# Patient Record
Sex: Female | Born: 1965 | Race: White | Hispanic: No | Smoking: Current every day smoker
Health system: Southern US, Community
[De-identification: ages and names within clinical notes are randomized; demographics above are authoritative.]

## PROBLEM LIST (undated history)

## (undated) DIAGNOSIS — F32A Depression, unspecified: Secondary | ICD-10-CM

## (undated) DIAGNOSIS — F4310 Post-traumatic stress disorder, unspecified: Secondary | ICD-10-CM

## (undated) DIAGNOSIS — F329 Major depressive disorder, single episode, unspecified: Secondary | ICD-10-CM

## (undated) DIAGNOSIS — G8929 Other chronic pain: Secondary | ICD-10-CM

## (undated) DIAGNOSIS — M545 Low back pain: Secondary | ICD-10-CM

## (undated) DIAGNOSIS — R55 Syncope and collapse: Secondary | ICD-10-CM

## (undated) DIAGNOSIS — M1990 Unspecified osteoarthritis, unspecified site: Secondary | ICD-10-CM

## (undated) DIAGNOSIS — F419 Anxiety disorder, unspecified: Secondary | ICD-10-CM

## (undated) HISTORY — DX: Low back pain: M54.5

## (undated) HISTORY — PX: NECK SURGERY: SHX720

## (undated) HISTORY — PX: BACK SURGERY: SHX140

## (undated) HISTORY — DX: Post-traumatic stress disorder, unspecified: F43.10

## (undated) HISTORY — DX: Other chronic pain: G89.29

---

## 2015-11-01 ENCOUNTER — Emergency Department (HOSPITAL_COMMUNITY): Payer: Self-pay

## 2015-11-01 ENCOUNTER — Encounter (HOSPITAL_COMMUNITY): Payer: Self-pay | Admitting: Emergency Medicine

## 2015-11-01 ENCOUNTER — Emergency Department (HOSPITAL_COMMUNITY)
Admission: EM | Admit: 2015-11-01 | Discharge: 2015-11-01 | Disposition: A | Discharge status: Home / Self Care | Payer: Self-pay | Attending: Emergency Medicine | Admitting: Emergency Medicine

## 2015-11-01 DIAGNOSIS — F17200 Nicotine dependence, unspecified, uncomplicated: Secondary | ICD-10-CM | POA: Insufficient documentation

## 2015-11-01 DIAGNOSIS — R0602 Shortness of breath: Secondary | ICD-10-CM | POA: Insufficient documentation

## 2015-11-01 DIAGNOSIS — R0789 Other chest pain: Secondary | ICD-10-CM | POA: Insufficient documentation

## 2015-11-01 DIAGNOSIS — Z3202 Encounter for pregnancy test, result negative: Secondary | ICD-10-CM | POA: Insufficient documentation

## 2015-11-01 LAB — BASIC METABOLIC PANEL
Anion gap: 9 (ref 5–15)
BUN: 10 mg/dL (ref 6–20)
CALCIUM: 9.7 mg/dL (ref 8.9–10.3)
CHLORIDE: 105 mmol/L (ref 101–111)
CO2: 27 mmol/L (ref 22–32)
CREATININE: 0.81 mg/dL (ref 0.44–1.00)
Glucose, Bld: 101 mg/dL — ABNORMAL HIGH (ref 65–99)
Potassium: 4.2 mmol/L (ref 3.5–5.1)
SODIUM: 141 mmol/L (ref 135–145)

## 2015-11-01 LAB — CBC
HCT: 43.3 % (ref 36.0–46.0)
Hemoglobin: 14.8 g/dL (ref 12.0–15.0)
MCH: 32.3 pg (ref 26.0–34.0)
MCHC: 34.2 g/dL (ref 30.0–36.0)
MCV: 94.5 fL (ref 78.0–100.0)
PLATELETS: 243 10*3/uL (ref 150–400)
RBC: 4.58 MIL/uL (ref 3.87–5.11)
RDW: 12.9 % (ref 11.5–15.5)
WBC: 10.6 10*3/uL — AB (ref 4.0–10.5)

## 2015-11-01 LAB — HEPATIC FUNCTION PANEL
ALK PHOS: 66 U/L (ref 38–126)
ALT: 18 U/L (ref 14–54)
AST: 22 U/L (ref 15–41)
Albumin: 4.1 g/dL (ref 3.5–5.0)
BILIRUBIN TOTAL: 0.5 mg/dL (ref 0.3–1.2)
Total Protein: 7 g/dL (ref 6.5–8.1)

## 2015-11-01 LAB — I-STAT TROPONIN, ED: TROPONIN I, POC: 0 ng/mL (ref 0.00–0.08)

## 2015-11-01 LAB — I-STAT BETA HCG BLOOD, ED (MC, WL, AP ONLY): I-stat hCG, quantitative: <5 m[IU]/mL (ref <5)

## 2015-11-01 LAB — D-DIMER, QUANTITATIVE (NOT AT ARMC): D DIMER QUANT: 0.45 ug{FEU}/mL (ref 0.00–0.50)

## 2015-11-01 LAB — LIPASE, BLOOD: LIPASE: 30 U/L (ref 11–51)

## 2015-11-01 MED ORDER — KETOROLAC TROMETHAMINE 30 MG/ML IJ SOLN
30.0000 mg | Freq: Once | INTRAMUSCULAR | Status: AC
  Administered 2015-11-01: 30 mg via INTRAVENOUS
  Filled 2015-11-01: qty 1

## 2015-11-01 MED ORDER — GI COCKTAIL ~~LOC~~
30.0000 mL | Freq: Once | ORAL | Status: AC
  Administered 2015-11-01: 30 mL via ORAL
  Filled 2015-11-01: qty 30

## 2015-11-01 MED ORDER — ONDANSETRON HCL 4 MG/2ML IJ SOLN
4.0000 mg | Freq: Once | INTRAMUSCULAR | Status: AC
  Administered 2015-11-01: 4 mg via INTRAVENOUS
  Filled 2015-11-01: qty 2

## 2015-11-01 MED ORDER — HYDROMORPHONE HCL 1 MG/ML IJ SOLN
1.0000 mg | Freq: Once | INTRAMUSCULAR | Status: AC
  Administered 2015-11-01: 1 mg via INTRAVENOUS
  Filled 2015-11-01: qty 1

## 2015-11-01 NOTE — ED Notes (Addendum)
Pt would not allow me to check vitals. Request for IV to be taken out so she could leave since nobody will come in to give her any med. Nurse have been info

## 2015-11-01 NOTE — ED Notes (Signed)
Pt states she has been having some discomfort in her chest esp when she eats or swallows anything  Pt states yesterday she got a pain under her ribs on the right side  Pt states it hurts worse with movement and deep breaths  Pt denies injury

## 2015-11-01 NOTE — ED Provider Notes (Signed)
Patient received in sign out from PA [place] at shift change.  Please see prior note for full H&P.  Briefly, 50 y.o. F with 2 days of right sided chest pain, worse with deep inspiration.  Was having some pain with swallowing, however none currently.  On exam, pain was found to be reproducible with palpation.  No cardiac hx.  No hx of DVT or PE.    Plan:  Lab work thus far has been reassuring including d-dimer and troponin.  Waiting for hepatic panel and lipase.  If negative, patient to be discharged home with treatment for MSK chest wall pain.    Results for orders placed or performed during the hospital encounter of 11/01/15  Basic metabolic panel  Result Value Ref Range   Sodium 141 135 - 145 mmol/L   Potassium 4.2 3.5 - 5.1 mmol/L   Chloride 105 101 - 111 mmol/L   CO2 27 22 - 32 mmol/L   Glucose, Bld 101 (H) 65 - 99 mg/dL   BUN 10 6 - 20 mg/dL   Creatinine, Ser 1.61 0.44 - 1.00 mg/dL   Calcium 9.7 8.9 - 09.6 mg/dL   GFR calc non Af Amer >60 >60 mL/min   GFR calc Af Amer >60 >60 mL/min   Anion gap 9 5 - 15  CBC  Result Value Ref Range   WBC 10.6 (H) 4.0 - 10.5 K/uL   RBC 4.58 3.87 - 5.11 MIL/uL   Hemoglobin 14.8 12.0 - 15.0 g/dL   HCT 04.5 40.9 - 81.1 %   MCV 94.5 78.0 - 100.0 fL   MCH 32.3 26.0 - 34.0 pg   MCHC 34.2 30.0 - 36.0 g/dL   RDW 91.4 78.2 - 95.6 %   Platelets 243 150 - 400 K/uL  D-dimer, quantitative (not at [facility])  Result Value Ref Range   D-Dimer, Quant 0.45 0.00 - 0.50 ug/mL-FEU  Hepatic function panel  Result Value Ref Range   Total Protein 7.0 6.5 - 8.1 g/dL   Albumin 4.1 3.5 - 5.0 g/dL   AST 22 15 - 41 U/L   ALT 18 14 - 54 U/L   Alkaline Phosphatase 66 38 - 126 U/L   Total Bilirubin 0.5 0.3 - 1.2 mg/dL   Bilirubin, Direct <2.1 (L) 0.1 - 0.5 mg/dL   Indirect Bilirubin NOT CALCULATED 0.3 - 0.9 mg/dL  Lipase, blood  Result Value Ref Range   Lipase 30 11 - 51 U/L  I-stat troponin, ED (not at [facility])  Result Value Ref Range   Troponin i, poc 0.00 0.00 -  0.08 ng/mL   Comment 3           Dg Chest 2 View  11/01/2015  CLINICAL DATA:  Acute onset of generalized chest discomfort. Shortness of breath. Initial encounter. EXAM: CHEST  2 VIEW COMPARISON:  None. FINDINGS: The lungs are well-aerated and clear. There is no evidence of focal opacification, pleural effusion or pneumothorax. The heart is normal in size; the mediastinal contour is within normal limits. No acute osseous abnormalities are seen. Cervical spinal fusion hardware is partially imaged. IMPRESSION: No acute cardiopulmonary process seen. Electronically Signed   By: Roanna Raider M.D.   On: 11/01/2015 02:52    7:42 AM Went in to re-evaluate patient and she begins yelling "take this IV out, i am over this and no one is helping me".  I asked patient what i could do for her and she states she just wanted to leave.  She did  not allow me to discuss her remaining lab results, she just began getting dressed and gathering her belongins.  Made nurse aware to take IV out prior to patient leaving.  Encouraged to follow-up with a PCP in the area and was given resource guide.  Discussed plan with patient, he/she acknowledged understanding and agreed with plan of care.  Return precautions given for new or worsening symptoms.  Garlon HatchetLisa M Sheridyn Canino, PA-C 11/01/15 [id number]  Leta BaptistEmily Roe Nguyen, MD 11/02/15 [phone]

## 2015-11-01 NOTE — ED Provider Notes (Signed)
CSN: [id number]     Arrival date & time 11/01/15  0131 History   First MD Initiated Contact with Patient 11/01/15 [phone]     Chief Complaint  Patient presents with  . Chest Pain     (Consider location/radiation/quality/duration/timing/severity/associated sxs/prior Treatment) HPI Comments: Patient is a 50 year old female with a history of neck surgery and back surgery. She presents to the emergency department for further evaluation of right-sided chest pain. She states that pain has been worse over the past 2 days. She describes the pain as throbbing with intermittent sharp pain sensations. It is worse with movement and deep breathing. She took a BC powder without relief. Patient stated that she had previously noted some discomfort in her central chest and throat when she would eat or swallow. She has not had any issues with this today. Patient denies a hx of similar symptoms. No associated fever, syncope, near syncope, leg swelling, vomiting, diarrhea, and abdominal pain. No hematuria or dysuria. No recent surgeries or hospitalizations. No recent travel or hx of blood clots. Also no hx of ACS, DM, HTN, or HLD.  Patient is a 50 y.o. female presenting with chest pain. The history is provided by the patient. No language interpreter was used.  Chest Pain Associated symptoms: shortness of breath   Associated symptoms: no fever, no nausea and not vomiting     History reviewed. No pertinent past medical history. Past Surgical History  Procedure Laterality Date  . Neck surgery    . Back surgery     Family History  Problem Relation Age of Onset  . Cancer Other    Social History  Substance Use Topics  . Smoking status: Current Some Day Smoker  . Smokeless tobacco: None  . Alcohol Use: No   OB History    No data available      Review of Systems  Constitutional: Negative for fever.  Respiratory: Positive for shortness of breath. Negative for apnea.   Cardiovascular: Positive for chest  pain. Negative for leg swelling.  Gastrointestinal: Negative for nausea and vomiting.  Neurological: Negative for syncope.  All other systems reviewed and are negative.   Allergies  Review of patient's allergies indicates no known allergies.  Home Medications   Prior to Admission medications   Not on File   BP 131/87 mmHg  Pulse 74  Temp(Src) 97.7 F (36.5 C) (Oral)  Resp 20  SpO2 98%  LMP    Physical Exam  Constitutional: She is oriented to person, place, and time. She appears well-developed and well-nourished. No distress.  Nontoxic/nonseptic appearing  HENT:  Head: Normocephalic and atraumatic.  Mouth/Throat: Oropharynx is clear and moist. No oropharyngeal exudate.  Eyes: Conjunctivae and EOM are normal. No scleral icterus.  Neck: Normal range of motion.  Cardiovascular: Normal rate, regular rhythm and intact distal pulses.   Pulmonary/Chest: Effort normal and breath sounds normal. No respiratory distress. She has no wheezes. She has no rales. She exhibits tenderness. She exhibits no crepitus and no deformity.    Lungs grossly clear. There is splinting with deep inspiration. No rales or rhonchi. Chest expansion symmetric.  Abdominal: Soft. She exhibits no distension. There is no tenderness. There is no rebound and no guarding.  Soft, nontender and nondistended abdomen. No peritoneal signs or guarding. Negative Murphy sign.  Musculoskeletal: Normal range of motion.  Neurological: She is alert and oriented to person, place, and time. She exhibits normal muscle tone. Coordination normal.  Patient moving all extremities  Skin: Skin is warm  and dry. No rash noted. She is not diaphoretic. No erythema. No pallor.  Psychiatric: She has a normal mood and affect. Her behavior is normal.  Nursing note and vitals reviewed.   ED Course  Procedures (including critical care time) Labs Review Labs Reviewed  BASIC METABOLIC PANEL - Abnormal; Notable for the following:    Glucose,  Bld 101 (*)    All other components within normal limits  CBC - Abnormal; Notable for the following:    WBC 10.6 (*)    All other components within normal limits  D-DIMER, QUANTITATIVE (NOT AT [facility])  HEPATIC FUNCTION PANEL  LIPASE, BLOOD  I-STAT TROPOININ, ED  I-STAT BETA HCG BLOOD, ED ([facility], [facility], AP ONLY)    Imaging Review Dg Chest 2 View  11/01/2015  CLINICAL DATA:  Acute onset of generalized chest discomfort. Shortness of breath. Initial encounter. EXAM: CHEST  2 VIEW COMPARISON:  None. FINDINGS: The lungs are well-aerated and clear. There is no evidence of focal opacification, pleural effusion or pneumothorax. The heart is normal in size; the mediastinal contour is within normal limits. No acute osseous abnormalities are seen. Cervical spinal fusion hardware is partially imaged. IMPRESSION: No acute cardiopulmonary process seen. Electronically Signed   By: Roanna Raider M.D.   On: 11/01/2015 02:52   I have personally reviewed and evaluated these images and lab results as part of my medical decision-making.   EKG Interpretation   Date/Time:  Monday November 01 2015 01:42:47 EST Ventricular Rate:  74 PR Interval:  223 QRS Duration: 99 QT Interval:  394 QTC Calculation: 437 R Axis:   -46 Text Interpretation:  Sinus rhythm Prolonged PR interval Confirmed by  [facility]  MD, APRIL ([id number]) on 11/01/2015 3:35:02 AM      MDM   Final diagnoses:  Right-sided chest wall pain    50 year old female presents to the emergency department for evaluation of right-sided chest pain. She reports some discomfort with swallowing 2 days prior to onset of symptoms, but has no complaints of this today. Right sided chest pain has been constant since onset. Pain is worse with deep breathing and movement. It is reproducible on palpation making musculoskeletal etiology higher on the differential.   Patient denies any cardiac history. She has a nonischemic EKG with a negative troponin. Doubt ACS,  especially given atypical nature of symptoms. Pulmonary embolus considered. Patient has had no tachycardia or hypoxia. She has a negative d-dimer. No indication for CT scan. Gallbladder etiology also considered. LFTs are pending. Patient signed out to Sharilyn Sites, PA-C at change of shift. Toradol and Dilaudid given for pain control as GI cocktail provided no relief.   Filed Vitals:   11/01/15 0143 11/01/15 0446  BP: 131/87 117/94  Pulse: 74 75  Temp: 97.7 F (36.5 C)   TempSrc: Oral   Resp: 20 17  SpO2: 98% 100%       Antony Madura, PA-C 11/01/15 0703  Cy Blamer, MD 11/01/15 [phone]

## 2015-11-01 NOTE — Discharge Instructions (Signed)
Your work-up today was normal. Please follow-up with a primary care physician.  If you do not have one, please find one in the area.  Resource guide attached to help with this. Return here for new concerns.   Emergency Department Resource Guide 1) Find a Doctor and Pay Out of Pocket Although you won't have to find out who is covered by your insurance plan, it is a good idea to ask around and get recommendations. You will then need to call the office and see if the doctor you have chosen will accept you as a new patient and what types of options they offer for patients who are self-pay. Some doctors offer discounts or will set up payment plans for their patients who do not have insurance, but you will need to ask so you aren't surprised when you get to your appointment.  2) Contact Your Local Health Department Not all health departments have doctors that can see patients for sick visits, but many do, so it is worth a call to see if yours does. If you don't know where your local health department is, you can check in your phone book. The CDC also has a tool to help you locate your state's health department, and many state websites also have listings of all of their local health departments.  3) Find a Walk-in Clinic If your illness is not likely to be very severe or complicated, you may want to try a walk in clinic. These are popping up all over the country in pharmacies, drugstores, and shopping centers. They're usually staffed by nurse practitioners or physician assistants that have been trained to treat common illnesses and complaints. They're usually fairly quick and inexpensive. However, if you have serious medical issues or chronic medical problems, these are probably not your best option.  No Primary Care Doctor: - Call Health Connect at  [phone] - they can help you locate a primary care doctor that  accepts your insurance, provides certain services, etc. - Physician Referral Service-  [phone]  Chronic Pain Problems: Organization         Address  Phone   Notes  [facility]  [phone] Patients need to be referred by their primary care doctor.   Medication Assistance: Organization         Address  Phone   Notes  [facility] [address] [place], [place] [address] [phone] --Must be a resident of [facility] [place] -- Must have NO insurance coverage whatsoever (no Medicaid/ Medicare, etc.) -- The pt. MUST have a primary care doctor that directs their care regularly and follows them in the community   MedAssist  [phone]   [employer]  [phone]    Agencies that provide inexpensive medical care: Organization         Address  Phone   Notes  [facility]  [phone]   [facility]    [phone]   [facility] [address] [place], [place] [address] [phone]   [facility] [address], [place] [phone]   Planned Parenthood    [phone]   [facility]    [phone]   [facility]  [address], [place] Phone:  [phone], Fax:  [phone] Hours of Operation:  9 am - 6 pm, M-F.  Also accepts Medicaid/Medicare and self-pay.  [facility] for [place] [address], [address], [place] Phone: [phone], Fax: [phone]. Hours of Operation:  8:30 am - 5:30 pm, M-F.  Also accepts Medicaid and self-pay.  [facility] [place] [address] [place] Phone: [phone]   [place], [place], [place] [phone], Ext. [phone] Mondays & Thursdays: 7-9 AM.  First 15 patients are seen on a first come, first serve basis.    [place] Providers:  Organization         Address  Phone   Notes  [facility] [address],  [place] [phone] Also accepts self-pay patients.  [facility] [id number] [place], [place]  [phone]   [place], [address], [place] [phone]   [facility] [address] [place] [phone]   Lucianne Lei [address], [place]   [phone] Only accepts [place] Access [place] patients after they have their name applied to their card.   Self-Pay (no insurance) in [facility]:  Organization         Address  Phone   Notes  Sickle Cell Patients, [facility] [place] [phone]   [facility]gent [facility] [place] [phone]   [facility] [place]  [place], [place], [place] [phone]   [facility]/Dr. Osei-Bonsu  [address] [place] [address] [place] or [address], [place] [phone] Phone number for both [place] and [place] locations is the same.  Urgent Medical and [facility] [address], [place] [phone]   [facility] [address] [place] or [address] [phone]   [facility] [address], [place] [phone], phone; [phone], fax Sees patients 1st and 3rd Saturday of every month.  Must not qualify for public or private insurance (i.e. Medicaid, Medicare, [place] Health Choice, Veterans' Benefits)  Household income should be no more than 200% of the poverty level The clinic cannot treat you if you are pregnant or think you are pregnant  Sexually transmitted diseases are not treated at the clinic.    Dental Care: Organization         Address  Phone  Notes  [facility] of [facility] [place] [phone] Accepts children up to age 81 who are enrolled in  [place] or [place]; pregnant women with a Medicaid card; and children who have applied for Medicaid or  Health Choice, but were declined, whose parents can pay a reduced fee at time of service.  [facility] of [facility]  [address], [place] [phone] Accepts children up to age 44 who are enrolled in [place] or [place]; pregnant women with a Medicaid card; and children who have applied for Medicaid or  Health Choice, but were declined, whose parents can pay a reduced fee at time of service.  [facility] PRO[facility]  [place] [phone] Patients are seen by appointment only. Walk-ins are not accepted. [place] will see patients 52 years of age and older. Monday - Tuesday (8am-5pm) Most Wednesdays (8:30-5pm) $30 per  visit, cash only  [facility] Adult [employer] PROGRAM  [address], [address] [phone] Patients are seen by appointment only. Walk-ins are not accepted. [place] will see patients 48 years of age and older. One Wednesday Evening (Monthly: Volunteer Based).  $30 per visit, cash only  [place]  [phone] for adults; Children under age 62, call Graduate Pediatric Dentistry at [phone]. Children aged 41-14, please call [phone] to request a pediatric application.  Dental services are provided in all areas of dental care including fillings, crowns and bridges, complete and partial dentures, implants, gum treatment, root canals, and extractions. Preventive care is also provided. Treatment is provided to both adults and children. Patients are selected via a lottery and there is often a waiting list.   [facility] [address] [place]  [phone] [email or web address]   [facility] [address] [place], [place] [phone], Ext. [phone] Second and Fourth Thursday of each month, opens at 6:30  AM; Clinic ends at 9 AM.  Patients are seen on a first-come first-served basis, and a limited number are seen during each clinic.   [facility]  [address] [place] Danker [place], [place] [phone]   Eligibility Requirements You must have lived in [place], [place], or [place] counties for at least the last three months.   You cannot be eligible for state or federal sponsored [employer], including [employer], [place], or [employer].   You generally cannot be eligible for healthcare insurance through your employer.    How to apply: Eligibility screenings are held every Tuesday and Wednesday afternoon from 1:00 pm until 4:00 pm. You do not need an appointment for the interview!  [facility] [address], [place], [place]   [place]  [place] Department  [place]  [phone]    Behavioral Health Resources in the Community: Intensive Outpatient Programs Organization         Address  Phone  Notes  [facility] D[place]. [address] [place], [place] [phone]   [facility] Outpatient [address] [place], [place]   ADS: Alcohol & Drug Svcs [address] [place], [place]   [place] [address]  [place], [place] or [phone]   Substance Abuse Resources Organization         Address  Phone  Notes  Alcohol and Drug Services  [phone]   [place]  [phone]   The [place]   Chinita Pester  [phone]   Residential & Outpatient Substance Abuse Program  [phone]   Psychological Services Organization         Address  Phone  Notes  [facility] [place]  [place]  [phone]   [place] [address] [place] or  [phone]    Mobile Crisis Teams Organization         Address  Phone  Notes  [facility], Mobile Crisis Care Unit  [phone]   [facility] Services  [address] [place], [place]   [place] Levels [address], [place] [phone]    Self-Help/Support Groups Organization         Address  Phone  Notes  [facility]ental [facility] Assoc[facility] of [place] - variety of support groups  [place] Call for more information  Narcotics Anonymous (NA), [facility] [address], [employer] [place]  2 meetings at this location   Special educational needs teacher         Address  Phone  Notes  ASAP Residential Treatment [place],    [place]  [phone]   [facility]  [address] [place] [phone], [place], [place]   [place], [place] [phone] Admissions: 8am-3pm M-F  Incentives Substance [place] [address]    [place], [place] [phone]   [facility] [address] [place], [place], [place]   [facility] [address],  [place], [place]   [facility]ight Programs - Intensive Outpatient [address], [place], [place]   [facility] ([place] [address].,  [place], [place] [phone] or [phone]   [facility]idential Treatment Services (RTS) [address] [place], [place] Accepts Medicaid  [facility] [place] [address],  [place] [phone] Substance Abuse/Addiction Treatment   [facility] Organization         Address  Phone  Notes  [facility]  [phone]   Domenic Schwab, PhD [address] Arlis [place], [place]   [phone] or [phone]   [place] and [place], [place] [phone]   [facility] [address],  [place], [place] [phone] Insurance/Medicaid/sponsorship through [facility] and Families [address], [address] [place]                                    [place], [place] [phone] [place] [address] [place], [place] [phone]    Dr. Adele Schilder  [phone]   [facility] Dept. 1) [address], [place] 2) [place] 3)  [place] [address], [place] [phone]  [phone]   [place] [phone] or [phone] (After Hours)

## 2016-03-23 ENCOUNTER — Encounter (HOSPITAL_COMMUNITY): Payer: Self-pay | Admitting: Emergency Medicine

## 2016-03-23 ENCOUNTER — Emergency Department (HOSPITAL_COMMUNITY)
Admission: EM | Admit: 2016-03-23 | Discharge: 2016-03-23 | Disposition: A | Discharge status: Home / Self Care | Payer: Self-pay | Attending: Emergency Medicine | Admitting: Emergency Medicine

## 2016-03-23 DIAGNOSIS — Z79899 Other long term (current) drug therapy: Secondary | ICD-10-CM | POA: Insufficient documentation

## 2016-03-23 DIAGNOSIS — F17200 Nicotine dependence, unspecified, uncomplicated: Secondary | ICD-10-CM | POA: Insufficient documentation

## 2016-03-23 DIAGNOSIS — M5441 Lumbago with sciatica, right side: Secondary | ICD-10-CM | POA: Insufficient documentation

## 2016-03-23 MED ORDER — KETOROLAC TROMETHAMINE 30 MG/ML IJ SOLN
30.0000 mg | Freq: Once | INTRAMUSCULAR | Status: AC
  Administered 2016-03-23: 30 mg via INTRAMUSCULAR
  Filled 2016-03-23: qty 1

## 2016-03-23 MED ORDER — PREDNISONE 20 MG PO TABS
60.0000 mg | ORAL_TABLET | Freq: Once | ORAL | Status: AC
  Administered 2016-03-23: 60 mg via ORAL
  Filled 2016-03-23: qty 3

## 2016-03-23 MED ORDER — NAPROXEN 500 MG PO TABS: 500.0000 mg | ORAL_TABLET | Freq: Two times a day (BID) | ORAL | Status: DC

## 2016-03-23 MED ORDER — ACETAMINOPHEN 500 MG PO TABS
1000.0000 mg | ORAL_TABLET | Freq: Once | ORAL | Status: AC
  Administered 2016-03-23: 1000 mg via ORAL
  Filled 2016-03-23: qty 2

## 2016-03-23 MED ORDER — PREDNISONE 20 MG PO TABS: 40.0000 mg | ORAL_TABLET | Freq: Every day | ORAL | Status: DC

## 2016-03-23 NOTE — ED Notes (Signed)
Discharge instructions, follow up care, and rx x2 reviewed with patient. Patient verbalized understanding. 

## 2016-03-23 NOTE — ED Provider Notes (Signed)
CSN: [id number]     Arrival date & time 03/23/16  1006 History   First MD Initiated Contact with Patient 03/23/16 1043     Chief Complaint  Patient presents with  . Abscess     HPI   Crystal Baker is an 50 y.o. female with history of chronic pain who presents to the ED for evaluation of low back pain that she believes is due to a mass in her lower right back. She states that she first noticed pain about one week ago and when she felt around in the area she felt a small lump in her lower right back. States the lump will sometimes get bigger and sometimes get small. She states that it feels like it is pinching on her nerves and causing a lot of pain. States she has associated pain shooting down her right leg at times. Denies fever or chills. States the mass is "deep inside" and denies any kind of drainage. Denies h/o IVDU. She does state she recently moved the area and used to be a pain management patient, but has not established care locally.  History reviewed. No pertinent past medical history. Past Surgical History  Procedure Laterality Date  . Neck surgery    . Back surgery     Family History  Problem Relation Age of Onset  . Cancer Other    Social History  Substance Use Topics  . Smoking status: Current Some Day Smoker  . Smokeless tobacco: None  . Alcohol Use: No   OB History    No data available     Review of Systems  All other systems reviewed and are negative.     Allergies  Review of patient's allergies indicates no known allergies.  Home Medications   Prior to Admission medications   Medication Sig Start Date End Date Taking? Authorizing Provider  naproxen (NAPROSYN) 500 MG tablet Take 1 tablet (500 mg total) by mouth 2 (two) times daily. 03/23/16   Ace GinsSerena Y Racer Quam, PA-C  predniSONE (DELTASONE) 20 MG tablet Take 2 tablets (40 mg total) by mouth daily. 03/23/16   Ace GinsSerena Y Devanshi Califf, PA-C   BP 116/88 mmHg  Pulse 77  Temp(Src) 98 F (36.7 C) (Oral)  Resp 18  SpO2  99% Physical Exam  Constitutional: She is oriented to person, place, and time. No distress.  HENT:  Head: Atraumatic.  Right Ear: External ear normal.  Left Ear: External ear normal.  Nose: Nose normal.  Eyes: Conjunctivae are normal. No scleral icterus.  Neck: Normal range of motion. Neck supple.  Cardiovascular: Normal rate and regular rhythm.   Pulmonary/Chest: Effort normal. No respiratory distress. She exhibits no tenderness.  Abdominal: Soft. She exhibits no distension. There is no tenderness.  Musculoskeletal:  Lower right paraspinal area with ttp. There is an oblong area of tissue that does feel like a distinct area of soft tissue different from surrounding tissue. Tender. No overlying erythema or other skin changes. Left side does have a similar feeling area, though less pronounced.  Neurological: She is alert and oriented to person, place, and time.  Skin: Skin is warm and dry. She is not diaphoretic.  Psychiatric: She has a normal mood and affect. Her behavior is normal.  Nursing note and vitals reviewed.   ED Course  Procedures (including critical care time) Labs Review Labs Reviewed - No data to display  Imaging Review No results found. I have personally reviewed and evaluated these images and lab results as part of my medical  decision-making.   EKG Interpretation None      MDM   Final diagnoses:  Right-sided low back pain with right-sided sciatica    Pt was evaluated by Dr. Donnald Garre as well who feels no significant difference between pt's two sides of her back on exam. Do not feel there is a distinct mass in the area. Will plan to treat as sciatica. Resource guide given to establish PCP f/u in the area. ER return precautions given.    Carlene Coria, PA-C 03/23/16 1402  Arby Barrette, MD 03/23/16 [phone]

## 2016-03-23 NOTE — Discharge Instructions (Signed)
Take medications as prescribed. Return to the emergency room for worsening condition or new concerning symptoms. Follow up with your regular doctor. If you don't have a regular doctor use one of the numbers below to establish a primary care doctor. ° ° °Emergency Department Resource Guide °1) Find a Doctor and Pay Out of Pocket °Although you won't have to find out who is covered by your insurance plan, it is a good idea to ask around and get recommendations. You will then need to call the office and see if the doctor you have chosen will accept you as a new patient and what types of options they offer for patients who are self-pay. Some doctors offer discounts or will set up payment plans for their patients who do not have insurance, but you will need to ask so you aren't surprised when you get to your appointment. ° °2) Contact Your Local Health Department °Not all health departments have doctors that can see patients for sick visits, but many do, so it is worth a call to see if yours does. If you don't know where your local health department is, you can check in your phone book. The CDC also has a tool to help you locate your state's health department, and many state websites also have listings of all of their local health departments. ° °3) Find a Walk-in Clinic °If your illness is not likely to be very severe or complicated, you may want to try a walk in clinic. These are popping up all over the country in pharmacies, drugstores, and shopping centers. They're usually staffed by nurse practitioners or physician assistants that have been trained to treat common illnesses and complaints. They're usually fairly quick and inexpensive. However, if you have serious medical issues or chronic medical problems, these are probably not your best option. ° °No Primary Care Doctor: °- Call [facility] at  832-8000 - they can help you locate a primary care doctor that  accepts your insurance, provides certain services,  etc. °- Physician Referral Service- 1-800-533-3463 ° °Emergency Department Resource Guide °1) Find a Doctor and Pay Out of Pocket °Although you won't have to find out who is covered by your insurance plan, it is a good idea to ask around and get recommendations. You will then need to call the office and see if the doctor you have chosen will accept you as a new patient and what types of options they offer for patients who are self-pay. Some doctors offer discounts or will set up payment plans for their patients who do not have insurance, but you will need to ask so you aren't surprised when you get to your appointment. ° °2) Contact Your Local Health Department °Not all health departments have doctors that can see patients for sick visits, but many do, so it is worth a call to see if yours does. If you don't know where your local health department is, you can check in your phone book. The CDC also has a tool to help you locate your state's health department, and many state websites also have listings of all of their local health departments. ° °3) Find a Walk-in Clinic °If your illness is not likely to be very severe or complicated, you may want to try a walk in clinic. These are popping up all over the country in pharmacies, drugstores, and shopping centers. They're usually staffed by nurse practitioners or physician assistants that have been trained to treat common illnesses and complaints. They're usually fairly   quick and inexpensive. However, if you have serious medical issues or chronic medical problems, these are probably not your best option. ° °No Primary Care Doctor: °- Call [facility] at  832-8000 - they can help you locate a primary care doctor that  accepts your insurance, provides certain services, etc. °- Physician Referral Service- 1-800-533-3463 ° °Chronic Pain Problems: °Organization         Address  Phone   Notes  °[facility]  ([phone]) [phone] Patients need to be referred by  their primary care doctor.  ° °Medication Assistance: °Organization         Address  Phone   Notes  °[place] [facility] [address] °[place], [place] [address] [phone]) [phone] --Must be a resident of [place] °-- Must have NO insurance coverage whatsoever (no Medicaid/ Medicare, etc.) °-- The pt. MUST have a primary care doctor that directs their care regularly and follows them in the community °  °[facility]  (866) 331-1348   °[employer]  (888) 892-1162   ° °Agencies that provide inexpensive medical care: °Organization         Address  Phone   Notes  °[facility]  ([phone]) [phone]   °[facility]    (336) [phone]   °[facility] [address] °[place], [place] [address] (336) [phone]   °[facility] [address], °[place] (336) [phone]   °[facility]    (336) [phone]   °[facility]    (336) 272-1050   °[facility] ° [address], [place] Phone:  (336) [phone], Fax:  (336) [phone] Hours of Operation:  9 am - 6 pm, M-F.  Also accepts Medicaid/Medicare and self-pay.  °[facility] ° [address], [place] Phone: (336) [phone], Fax: ([phone]) [phone]. Hours of Operation:  8:30 am - 5:30 pm, M-F.  Also accepts Medicaid and self-pay.  °[facility] [place] [address], [place] Phone: (336) [phone]   °[facility] [address], [place], [place] (336)723-1848, Ext. 123 Mondays & Thursdays: 7-9 AM.  First 15 patients are seen on a first come, first serve basis. °  ° °Medicaid-accepting [place] Providers: ° °Organization         Address  Phone   Notes  °[facility] [address], [place] ([phone]) [phone] Also accepts self-pay patients.  °[facility] [address], [place] ° (336) [phone]   °[facility]  [address], [place] ([phone]) [phone]   °[facility] [address] [place] [address], [place] ([phone]) [phone]   °[facility] [address], [place]  ° ([phone]) [phone] Only accepts [place] Access Medicaid patients after they have their name applied to their card.  ° °Self-Pay (no insurance) in [place]: ° °Organization         Address  Phone   Notes  °Sickle Cell Patients, [facility] [address], [place] (336) [phone]   °[facility] [address],  ([phone]) [phone]   ° [facility] ° [address], [place] ([phone]) [phone]   °[facility]/Dr. Osei-Bonsu ° [address] [place] [address],  or [address], [place] [phone]) [phone] Phone number   for both [place] and [place] locations is the same.  °[facility] [address], [place] ([phone]) [phone]   °[facility] [address] [place] [address], [place] or [address] (336) [phone] °(336) [phone]   °[facility] [address], [place] (336) [phone], phone; ([phone]) [phone], fax Sees patients 1st and 3rd Saturday of every month.  Must not qualify for public or private insurance (i.e. Medicaid, Medicare, [employer], Veterans' Benefits) • Household income should be no more than 200% of the poverty level •The clinic cannot treat you if you are pregnant or think you are pregnant • Sexually transmitted diseases are not treated at the clinic.  ° ° ° °

## 2016-03-23 NOTE — ED Notes (Signed)
Per pt, states palpable mass on right lower back/buttocks-been there over a month

## 2016-03-23 NOTE — ED Notes (Signed)
MD at bedside. 

## 2016-03-29 ENCOUNTER — Other Ambulatory Visit (HOSPITAL_COMMUNITY): Payer: Self-pay

## 2016-03-29 ENCOUNTER — Observation Stay (HOSPITAL_COMMUNITY)
Admission: EM | Admit: 2016-03-29 | Discharge: 2016-03-30 | Disposition: A | Discharge status: Home / Self Care | Payer: Self-pay | Attending: Internal Medicine | Admitting: Internal Medicine

## 2016-03-29 ENCOUNTER — Emergency Department (HOSPITAL_COMMUNITY): Payer: Self-pay

## 2016-03-29 ENCOUNTER — Encounter (HOSPITAL_COMMUNITY): Payer: Self-pay | Admitting: Emergency Medicine

## 2016-03-29 DIAGNOSIS — G8929 Other chronic pain: Secondary | ICD-10-CM | POA: Insufficient documentation

## 2016-03-29 DIAGNOSIS — R0789 Other chest pain: Secondary | ICD-10-CM | POA: Insufficient documentation

## 2016-03-29 DIAGNOSIS — M549 Dorsalgia, unspecified: Secondary | ICD-10-CM | POA: Insufficient documentation

## 2016-03-29 DIAGNOSIS — M5489 Other dorsalgia: Secondary | ICD-10-CM

## 2016-03-29 DIAGNOSIS — R079 Chest pain, unspecified: Secondary | ICD-10-CM | POA: Diagnosis present

## 2016-03-29 DIAGNOSIS — F419 Anxiety disorder, unspecified: Secondary | ICD-10-CM | POA: Insufficient documentation

## 2016-03-29 DIAGNOSIS — F418 Other specified anxiety disorders: Secondary | ICD-10-CM

## 2016-03-29 DIAGNOSIS — F329 Major depressive disorder, single episode, unspecified: Secondary | ICD-10-CM | POA: Insufficient documentation

## 2016-03-29 DIAGNOSIS — F17210 Nicotine dependence, cigarettes, uncomplicated: Secondary | ICD-10-CM | POA: Insufficient documentation

## 2016-03-29 DIAGNOSIS — M1990 Unspecified osteoarthritis, unspecified site: Secondary | ICD-10-CM | POA: Insufficient documentation

## 2016-03-29 DIAGNOSIS — R55 Syncope and collapse: Principal | ICD-10-CM

## 2016-03-29 DIAGNOSIS — F17200 Nicotine dependence, unspecified, uncomplicated: Secondary | ICD-10-CM

## 2016-03-29 HISTORY — DX: Anxiety disorder, unspecified: F41.9

## 2016-03-29 HISTORY — DX: Depression, unspecified: F32.A

## 2016-03-29 HISTORY — DX: Unspecified osteoarthritis, unspecified site: M19.90

## 2016-03-29 HISTORY — DX: Syncope and collapse: R55

## 2016-03-29 HISTORY — DX: Major depressive disorder, single episode, unspecified: F32.9

## 2016-03-29 LAB — RAPID URINE DRUG SCREEN, HOSP PERFORMED
AMPHETAMINES: NOT DETECTED
BENZODIAZEPINES: NOT DETECTED
Barbiturates: NOT DETECTED
Cocaine: NOT DETECTED
Opiates: POSITIVE — AB
TETRAHYDROCANNABINOL: POSITIVE — AB

## 2016-03-29 LAB — LIPID PANEL
CHOL/HDL RATIO: 2.8 ratio
Cholesterol: 174 mg/dL (ref 0–200)
HDL: 63 mg/dL (ref >40)
LDL CALC: 95 mg/dL (ref 0–99)
TRIGLYCERIDES: 80 mg/dL (ref <150)
VLDL: 16 mg/dL (ref 0–40)

## 2016-03-29 LAB — CBC
HCT: 41.7 % (ref 36.0–46.0)
HEMOGLOBIN: 13.8 g/dL (ref 12.0–15.0)
MCH: 31.6 pg (ref 26.0–34.0)
MCHC: 33.1 g/dL (ref 30.0–36.0)
MCV: 95.4 fL (ref 78.0–100.0)
PLATELETS: 211 10*3/uL (ref 150–400)
RBC: 4.37 MIL/uL (ref 3.87–5.11)
RDW: 12.7 % (ref 11.5–15.5)
WBC: 10 10*3/uL (ref 4.0–10.5)

## 2016-03-29 LAB — BASIC METABOLIC PANEL
ANION GAP: 5 (ref 5–15)
BUN: 10 mg/dL (ref 6–20)
CALCIUM: 9.3 mg/dL (ref 8.9–10.3)
CO2: 27 mmol/L (ref 22–32)
CREATININE: 0.93 mg/dL (ref 0.44–1.00)
Chloride: 107 mmol/L (ref 101–111)
GFR calc Af Amer: >60 mL/min (ref >60)
GLUCOSE: 100 mg/dL — AB (ref 65–99)
Potassium: 3.9 mmol/L (ref 3.5–5.1)
Sodium: 139 mmol/L (ref 135–145)

## 2016-03-29 LAB — DIFFERENTIAL
BASOS PCT: 0 %
Basophils Absolute: 0 10*3/uL (ref 0.0–0.1)
EOS ABS: 0.3 10*3/uL (ref 0.0–0.7)
EOS PCT: 3 %
Lymphocytes Relative: 24 %
Lymphs Abs: 2.4 10*3/uL (ref 0.7–4.0)
MONO ABS: 0.7 10*3/uL (ref 0.1–1.0)
Monocytes Relative: 7 %
Neutro Abs: 6.6 10*3/uL (ref 1.7–7.7)
Neutrophils Relative %: 66 %

## 2016-03-29 LAB — PREGNANCY, URINE: Preg Test, Ur: NEGATIVE

## 2016-03-29 LAB — TROPONIN I
Troponin I: <0.03 ng/mL (ref <0.031)
Troponin I: <0.03 ng/mL (ref <0.031)

## 2016-03-29 LAB — I-STAT TROPONIN, ED: TROPONIN I, POC: 0 ng/mL (ref 0.00–0.08)

## 2016-03-29 MED ORDER — ENOXAPARIN SODIUM 40 MG/0.4ML ~~LOC~~ SOLN
40.0000 mg | SUBCUTANEOUS | Status: DC
  Administered 2016-03-29: 40 mg via SUBCUTANEOUS
  Filled 2016-03-29: qty 0.4

## 2016-03-29 MED ORDER — MORPHINE SULFATE (PF) 2 MG/ML IV SOLN
2.0000 mg | INTRAVENOUS | Status: DC | PRN
  Administered 2016-03-29 – 2016-03-30 (×4): 2 mg via INTRAVENOUS
  Filled 2016-03-29 (×4): qty 1

## 2016-03-29 MED ORDER — ACETAMINOPHEN 650 MG RE SUPP: 650.0000 mg | Freq: Four times a day (QID) | RECTAL | Status: DC | PRN

## 2016-03-29 MED ORDER — ACETAMINOPHEN 325 MG PO TABS: 650.0000 mg | ORAL_TABLET | Freq: Four times a day (QID) | ORAL | Status: DC | PRN

## 2016-03-29 MED ORDER — SODIUM CHLORIDE 0.9% FLUSH
3.0000 mL | Freq: Two times a day (BID) | INTRAVENOUS | Status: DC
  Administered 2016-03-29 – 2016-03-30 (×3): 3 mL via INTRAVENOUS

## 2016-03-29 MED ORDER — MORPHINE SULFATE (PF) 4 MG/ML IV SOLN
4.0000 mg | INTRAVENOUS | Status: DC | PRN
  Administered 2016-03-29 (×2): 4 mg via INTRAVENOUS
  Filled 2016-03-29 (×3): qty 1

## 2016-03-29 MED ORDER — IOPAMIDOL (ISOVUE-370) INJECTION 76%
INTRAVENOUS | Status: AC
  Administered 2016-03-29: 100 mL
  Filled 2016-03-29: qty 100

## 2016-03-29 MED ORDER — SODIUM CHLORIDE 0.9% FLUSH: 3.0000 mL | INTRAVENOUS | Status: DC | PRN

## 2016-03-29 MED ORDER — MORPHINE SULFATE (PF) 4 MG/ML IV SOLN
4.0000 mg | Freq: Once | INTRAVENOUS | Status: AC
  Administered 2016-03-29: 4 mg via INTRAVENOUS
  Filled 2016-03-29: qty 1

## 2016-03-29 NOTE — Progress Notes (Signed)
Pt complaint of 5/10 sharp chest pain which, "goes through to my back and up between my shoulder blades." Pt states it hurts "worse when I take a deep breath." When asked if there is anything that makes her chest pain better, pt states " that pain medicine they gave me down there."   Applied 1 LNC for comfort. MD notified. MD verbal order to give PRN morphine early and MD will come to bedside to assess. Will continue to monitor closely.

## 2016-03-29 NOTE — ED Notes (Signed)
Pt. Woke up this morning with central chest pain radiating to upper back with mild SOB , denies nausea / no diaphoresis , pain increases with deep inspiration /when coughing and changing positions . Pt. added brief syncopal episode this morning . Pt. received 4 baby ASA prior to arrival .

## 2016-03-29 NOTE — ED Provider Notes (Signed)
CSN: [id number]     Arrival date & time 03/29/16  0231 History  By signing my name below, I, Crystal Baker, attest that this documentation has been prepared under the direction and in the presence of Dione Boozeavid Tayelor Osborne, MD. Electronically Signed: Bethel BornBritney Baker, ED Scribe. 03/29/2016. 3:05 AM   Chief Complaint  Patient presents with  . Chest Pain   The history is provided by the patient. No language interpreter was used.   Crystal Baker is a 50 y.o. female with no significant PMHx who presents to the Emergency Department complaining of new, sharp, 8/10 in severity, central chest pain with onset this morning upon waking. The pain radiates to the upper back between the shoulder blades.  The pain is elicited with breathing. Laying flat exacerbates the pain. She took nothing for the pain at home. Associated symptoms include dizziness, syncope, nausea. Pt denies SOB and vomiting. She has no history of DM, HTN, or HLD. No known family history of heart disease. She smokes 1 PPD.   History reviewed. No pertinent past medical history. Past Surgical History  Procedure Laterality Date  . Neck surgery    . Back surgery     Family History  Problem Relation Age of Onset  . Cancer Other    Social History  Substance Use Topics  . Smoking status: Current Some Day Smoker  . Smokeless tobacco: None  . Alcohol Use: No   OB History    No data available     Review of Systems  Respiratory: Negative for shortness of breath.   Cardiovascular: Positive for chest pain.  Gastrointestinal: Positive for nausea. Negative for vomiting.  Neurological: Positive for dizziness and syncope.  All other systems reviewed and are negative.  Allergies  Review of patient's allergies indicates no known allergies.  Home Medications   Prior to Admission medications   Medication Sig Start Date End Date Taking? Authorizing Provider  naproxen (NAPROSYN) 500 MG tablet Take 1 tablet (500 mg total) by mouth 2 (two) times  daily. 03/23/16   Ace GinsSerena Y Sam, PA-C  predniSONE (DELTASONE) 20 MG tablet Take 2 tablets (40 mg total) by mouth daily. 03/23/16   Ace GinsSerena Y Sam, PA-C   BP 115/74 mmHg  Pulse 71  Temp(Src) 98.3 F (36.8 C) (Oral)  Resp 16  Ht 5\' 9"  (1.753 m)  Wt 153 lb (69.4 kg)  BMI 22.58 kg/m2  SpO2 100% Physical Exam  Constitutional: She is oriented to person, place, and time. She appears well-developed and well-nourished. No distress.  HENT:  Head: Normocephalic and atraumatic.  Eyes: EOM are normal. Pupils are equal, round, and reactive to light.  Neck: Normal range of motion. Neck supple. No JVD present.  Cardiovascular: Normal rate, regular rhythm, normal heart sounds and intact distal pulses.   No murmur heard. All peripheral pulses are strong   Pulmonary/Chest: Effort normal and breath sounds normal. She has no wheezes. She has no rales. She exhibits no tenderness.  Abdominal: Soft. Bowel sounds are normal. She exhibits no distension and no mass. There is no tenderness.  Musculoskeletal: Normal range of motion. She exhibits no edema.  Lymphadenopathy:    She has no cervical adenopathy.  Neurological: She is alert and oriented to person, place, and time. No cranial nerve deficit. She exhibits normal muscle tone. Coordination normal.  Skin: Skin is warm and dry. No rash noted.  Psychiatric: She has a normal mood and affect. Her behavior is normal. Judgment and thought content normal.  Nursing note and vitals  reviewed.   ED Course  Procedures (including critical care time) DIAGNOSTIC STUDIES: Oxygen Saturation is 100% on RA,  normal by my interpretation.    COORDINATION OF CARE: 3:05 AM Discussed treatment plan which includes lab work, CXR, EKG with pt at bedside and pt agreed to plan.  Labs Review Results for orders placed or performed during the hospital encounter of 03/29/16  Basic metabolic panel  Result Value Ref Range   Sodium 139 135 - 145 mmol/L   Potassium 3.9 3.5 - 5.1 mmol/L    Chloride 107 101 - 111 mmol/L   CO2 27 22 - 32 mmol/L   Glucose, Bld 100 (H) 65 - 99 mg/dL   BUN 10 6 - 20 mg/dL   Creatinine, Ser 1.61 0.44 - 1.00 mg/dL   Calcium 9.3 8.9 - 09.6 mg/dL   GFR calc non Af Amer >60 >60 mL/min   GFR calc Af Amer >60 >60 mL/min   Anion gap 5 5 - 15  CBC  Result Value Ref Range   WBC 10.0 4.0 - 10.5 K/uL   RBC 4.37 3.87 - 5.11 MIL/uL   Hemoglobin 13.8 12.0 - 15.0 g/dL   HCT 04.5 40.9 - 81.1 %   MCV 95.4 78.0 - 100.0 fL   MCH 31.6 26.0 - 34.0 pg   MCHC 33.1 30.0 - 36.0 g/dL   RDW 91.4 78.2 - 95.6 %   Platelets 211 150 - 400 K/uL  Differential  Result Value Ref Range   Neutrophils Relative % 66 %   Neutro Abs 6.6 1.7 - 7.7 K/uL   Lymphocytes Relative 24 %   Lymphs Abs 2.4 0.7 - 4.0 K/uL   Monocytes Relative 7 %   Monocytes Absolute 0.7 0.1 - 1.0 K/uL   Eosinophils Relative 3 %   Eosinophils Absolute 0.3 0.0 - 0.7 K/uL   Basophils Relative 0 %   Basophils Absolute 0.0 0.0 - 0.1 K/uL  I-stat troponin, ED  Result Value Ref Range   Troponin i, poc 0.00 0.00 - 0.08 ng/mL   Comment 3           Imaging Review Dg Chest 2 View  03/29/2016  CLINICAL DATA:  Awoke with central chest pain radiating to the back. Shortness of breath. EXAM: CHEST  2 VIEW COMPARISON:  11/01/2015 FINDINGS: The cardiomediastinal contours are normal. Mild central bronchial thickening. Scattered linear atelectasis in both lower lung zones. Pulmonary vasculature is normal. No consolidation, pleural effusion, or pneumothorax. No acute osseous abnormalities are seen. Hardware in the cervical spine is partially included. IMPRESSION: Mild bronchitic change and scattered bibasilar atelectasis. Electronically Signed   By: Rubye Oaks M.D.   On: 03/29/2016 03:03   Ct Angio Chest Aorta W/cm &/or Wo/cm  03/29/2016  CLINICAL DATA:  Initial evaluation for acute onset central chest pain radiating to upper back with shortness of breath. EXAM: CT ANGIOGRAPHY CHEST WITH CONTRAST TECHNIQUE:  Multidetector CT imaging of the chest was performed using the standard protocol during bolus administration of intravenous contrast. Multiplanar CT image reconstructions and MIPs were obtained to evaluate the vascular anatomy. COMPARISON:  Prior radiograph from earlier the same day. FINDINGS: Thyroid gland normal. No pathologically enlarged mediastinal lymph nodes. Scattered soft tissue density within the bilateral hila likely reflects small subcentimeter lymph nodes. No pathologic hilar adenopathy. No axillary lymph nodes. Precontrast imaging of the aorta a demonstrates no acute abnormality. Minimal plaque at the origin of the left subclavian artery. Postcontrast imaging through the aorta at demonstrates no evidence  for acute dissection or other acute abnormality. Aorta is of normal caliber without aneurysm. Visualized great vessels are normal. No mediastinal hematoma or other abnormality. Re-formatted imaging confirms these findings. Heart size normal. No significant coronary artery calcifications. No pericardial effusion. Limited evaluation of the pulmonary arteries grossly unremarkable. Lungs are clear without focal infiltrate or pulmonary edema. No pleural effusion. No pneumothorax. Upper lobe predominant bronchitic changes with probable air trapping. Cystic lucencies within the anterior aspect of the upper lobes also likely related to bronchiectasis. No worrisome pulmonary nodule or mass. Visualized upper abdomen within normal limits without acute abnormality. Possible small 11 mm adenoma at the left adrenal gland noted. No acute osseous abnormality. No worrisome lytic or blastic osseous lesions. ACDF partially visualized within the cervical spine. IMPRESSION: 1. No CTA evidence for acute aortic dissection or other acute aortic pathology. 2. No other acute abnormality within this chest. 3. Upper lobe predominant bronchiectasis with associated air trapping. 4. Minimal atheromatous plaque at the origin of the  left subclavian artery. No other significant atheromatous disease within the thorax. Electronically Signed   By: Rise Mu M.D.   On: 03/29/2016 05:33   I have personally reviewed and evaluated these images and lab results as part of my medical decision-making.   EKG Interpretation   Date/Time:  Wednesday Mar 29 2016 02:41:42 EDT Ventricular Rate:  72 PR Interval:  202 QRS Duration: 102 QT Interval:  385 QTC Calculation: 421 R Axis:   -24 Text Interpretation:  Sinus rhythm Borderline prolonged PR interval  Biatrial enlargement Borderline left axis deviation Anteroseptal infarct,  age indeterminate When compared with ECG of 11/01/2015, No significant  change was found Confirmed by [facility]  MD, Terryann Verbeek ([id number]) on 03/29/2016  3:06:15 AM Also confirmed by Preston Fleeting  MD, Jennea Rager ([id number]), editor Dan Humphreys, CCT,  SANDRA ([id number])  on 03/29/2016 7:16:14 AM      MDM   Final diagnoses:  Chest pain, unspecified chest pain type  Syncope and collapse    Back pain and chest pain with pattern force him for aortic dissection. She is sent for CT angiogram to evaluate this. She also had syncope. While syncope may be vasovagal related to her pain, she will need evaluation for this as well. She is given morphine for pain with significant relief. CT angiogram shows no evidence of dissection or pulmonary embolism. She will need to be admitted for further evaluation of her pain and syncope.Case is discussed with Dr. Andrey Campanile of internal medicine teaching service who agrees to admit the patient.  I personally performed the services described in this documentation, which was scribed in my presence. The recorded information has been reviewed and is accurate.      Dione Booze, MD 03/29/16 [phone]

## 2016-03-29 NOTE — H&P (Signed)
Date: 03/29/2016               Patient Name:  Crystal Baker MRN: [id number]  DOB: 04-Sep-1966 Age / Sex: 50 y.o., female   PCP: No Pcp Per Patient         Medical Service: Internal Medicine Teaching Service         Attending Physician: Dr. Inez CatalinaEmily B Mullen, MD    First Contact: Danelle Earthlyaryl Cunningham MS4 Pager: [phone]  Second Contact: Dr. Gara Kroneriana Truong Pager: [phone]       After Hours (After 5p/  First Contact Pager: [phone]  weekends / holidays): Second Contact Pager: [phone]   Chief Complaint: chest/interscapular pain and syncope  History of Present Illness: Crystal Baker is a 50 year old woman with PMH of syncope (childhood and young adulthood) and anxiety/depression here with acute c/o interscapular back pain and chest pain that awoke her from sleep last night.  She describes pain as 8/10, sharp and constant, located directly between shoulder blades and radiated into chest without radiation into arms or neck, worse with breathing, not positional.  There was associated diaphoresis and dyspnea.  She sat up in bed and her husband awoke.  She then felt lightheaded and reports LOC for several minutes.  She denies tongue biting or tonic-clonic movement but says she urinated on herself.  When she awoke she was not confused.  She denies palpitations, acute neck pain, visual change, reflux or anxiety during the event.  She was seen in the ED 4 months ago for right sided pleuritic chest pain but she says this feels different.  She reports being in her usual state of health prior to onset of symptoms last night.  She denies change in activity yesterday.  She does report occasional dyspnea (sometimes at rest, with exertion and laying flat) but does not usually have chest pain.  She denies LE pain/swelling, recent surgery or long car/plane rides.  She reports recurrent syncope in childhood and young adulthood (related to pain) that were attributed to significant traumas she experienced in childhood.  She says  she has never been told she has any cardiac problems and denies hx of HTN, HLD, DM.  She has not felt lightheaded or passed out in over 10 years.  There is no personal hx of syncope with exertion.  There is no family hx of sudden cardiac death.     She has smoked 1PPD x 35 years but denies illicit drug use or EtOH abuse.   She denies current everyday med use but does take OTC NSAIDS on occasion for chronic MSK pain from prior trauma and surgeries.  She was previously prescribed Klonopin and Zoloft for anxiety/depression but has been off of these medications for awhile since [facility] [place] [facility].  Chest and back pain are better in the ED, 5/10, after receiving IV pain med.    Meds:  She denies the use of daily medication.  Uses prn OTC analgesics for MSK pain.   Allergies: Allergies as of 03/29/2016  . (No Known Allergies)   Past Medical History  Diagnosis Date  . Anxiety and depression   . Syncope    Past Surgical History  Procedure Laterality Date  . Neck surgery    . Back surgery     Family History  Problem Relation Age of Onset  . Cancer Other   . Heart disease Neg Hx    Social History   Social History  . Marital Status: Divorced  Spouse Name: N/A  . Number of Children: N/A  . Years of Education: N/A   Occupational History  . Not on file.   Social History Main Topics  . Smoking status: Current Every Day Smoker -- 1.00 packs/day for 35 years    Types: Cigarettes  . Smokeless tobacco: Not on file  . Alcohol Use: No  . Drug Use: No  . Sexual Activity: Not on file   Other Topics Concern  . Not on file   Social History Narrative    Review of Systems: General:  Denies fever/chills, fatigue or unexplained weight loss HEENT:  + chronic neck/low back pain from previous surgery; denies change in vision, dysphagia Cardiac:  Per HPI Lungs:  Per HPI; + snoring but no gasping for air at night; denies cough, sputum or hemoptysis GI:  + nausea last night;  denies abdominal pain, reflux, diarrhea or vomiting GU:  Denies difficulty urinating, dysuria or hematuria Neuro:  Syncope per HPI; denies unilateral weakness, gait difficulties, paresthesias  Physical Exam: Blood pressure 101/75, pulse 58, temperature 98.3 F (36.8 C), temperature source Oral, resp. rate 19, height 5\' 9"  (1.753 m), weight 153 lb (69.4 kg), SpO2 96 %. General: resting in bed in NAD, pleasant and cooperative with exam HEENT: PERRL, EOMI, no scleral icterus, neck supple Cardiac: RRR, no rubs, murmurs or gallops; no carotid bruits or JVD; peripheral pulses 2+ B/L Pulm: clear to auscultation bilaterally, moving normal volumes of air Abd: soft, nontender, nondistended, BS present; no CVAT MSK:  There is no spine tenderness. Normal ROM of shoulders and neck. Ext: warm and well perfused, no pedal edema Neuro: alert and oriented X3, cranial nerves II-XII grossly intact, 5/5 MMS upper/lower, MAE x 4, sensation grossly intact and equal  Skin:  Several hypopigmented scars on arms and few on chest; there is no erythema or vesicular rash  Lab results: Basic Metabolic Panel:  Recent Labs  16/10/96 0310  NA 139  K 3.9  CL 107  CO2 27  GLUCOSE 100*  BUN 10  CREATININE 0.93  CALCIUM 9.3   CBC:  Recent Labs  03/29/16 0310  WBC 10.0  NEUTROABS 6.6  HGB 13.8  HCT 41.7  MCV 95.4  PLT 211   Cardiac Enzymes:  Recent Labs  03/29/16 0813  TROPONINI <0.03   Urine pregnancy test:  Negative  Urine Drug Screen: Drugs of Abuse     Component Value Date/Time   LABOPIA POSITIVE* 03/29/2016 0815   COCAINSCRNUR NONE DETECTED 03/29/2016 0815   LABBENZ NONE DETECTED 03/29/2016 0815   AMPHETMU NONE DETECTED 03/29/2016 0815   THCU POSITIVE* 03/29/2016 0815   LABBARB NONE DETECTED 03/29/2016 0815    Imaging results:  Dg Chest 2 View  03/29/2016  CLINICAL DATA:  Awoke with central chest pain radiating to the back. Shortness of breath. EXAM: CHEST  2 VIEW COMPARISON:   11/01/2015 FINDINGS: The cardiomediastinal contours are normal. Mild central bronchial thickening. Scattered linear atelectasis in both lower lung zones. Pulmonary vasculature is normal. No consolidation, pleural effusion, or pneumothorax. No acute osseous abnormalities are seen. Hardware in the cervical spine is partially included. IMPRESSION: Mild bronchitic change and scattered bibasilar atelectasis. Electronically Signed   By: Rubye Oaks M.D.   On: 03/29/2016 03:03   Ct Angio Chest Aorta W/cm &/or Wo/cm  03/29/2016  CLINICAL DATA:  Initial evaluation for acute onset central chest pain radiating to upper back with shortness of breath. EXAM: CT ANGIOGRAPHY CHEST WITH CONTRAST TECHNIQUE: Multidetector CT imaging of the  chest was performed using the standard protocol during bolus administration of intravenous contrast. Multiplanar CT image reconstructions and MIPs were obtained to evaluate the vascular anatomy. COMPARISON:  Prior radiograph from earlier the same day. FINDINGS: Thyroid gland normal. No pathologically enlarged mediastinal lymph nodes. Scattered soft tissue density within the bilateral hila likely reflects small subcentimeter lymph nodes. No pathologic hilar adenopathy. No axillary lymph nodes. Precontrast imaging of the aorta a demonstrates no acute abnormality. Minimal plaque at the origin of the left subclavian artery. Postcontrast imaging through the aorta at demonstrates no evidence for acute dissection or other acute abnormality. Aorta is of normal caliber without aneurysm. Visualized great vessels are normal. No mediastinal hematoma or other abnormality. Re-formatted imaging confirms these findings. Heart size normal. No significant coronary artery calcifications. No pericardial effusion. Limited evaluation of the pulmonary arteries grossly unremarkable. Lungs are clear without focal infiltrate or pulmonary edema. No pleural effusion. No pneumothorax. Upper lobe predominant bronchitic  changes with probable air trapping. Cystic lucencies within the anterior aspect of the upper lobes also likely related to bronchiectasis. No worrisome pulmonary nodule or mass. Visualized upper abdomen within normal limits without acute abnormality. Possible small 11 mm adenoma at the left adrenal gland noted. No acute osseous abnormality. No worrisome lytic or blastic osseous lesions. ACDF partially visualized within the cervical spine. IMPRESSION: 1. No CTA evidence for acute aortic dissection or other acute aortic pathology. 2. No other acute abnormality within this chest. 3. Upper lobe predominant bronchiectasis with associated air trapping. 4. Minimal atheromatous plaque at the origin of the left subclavian artery. No other significant atheromatous disease within the thorax. Electronically Signed   By: Rise Mu M.D.   On: 03/29/2016 05:33    Other results: EKG:  NSR, 72bpm, PR slightly prolonged at 202 (was 223 on previous EKG), normal QRS and QTc duration; minimal ST elevation w/ inverted Twaves V1-V2 (?Brugada pattern)  Assessment & Plan by Problem: 50 year old woman with PMH of syncope (childhood and young adulthood) and anxiety/depression here with acute c/o interscapular back pain and chest pain that awoke her from sleep last night.   Syncope/Chest and back pain:  Story concerning for aortic dissection, however CTA negative.  Well's score for PE is 0 and furthermore, CTA is negative.  No PTX or PNA on CXR.  She has significant smoking hx but no EKG changes and initial troponin negative making ACS less likely.  There could be cardiac structural abnormality or arrhythmia, though no family hx of cardiac disease or SCD; she does have recurrent syncope hx.   - admit to SDU (chest pain pain better but still present) - continous cardiac monitoring  - trend troponin - 2D ECHO - serial EKGs - if V1-V2 abnormalities persists, arrhythmia captured on tele or ECHO abnormalities, would consult  cards/EP - continue prn morphine for pain - check A1c and lipids for CVD risk stratification  Tobacco use disorder:  1PPD x 35 years.  - advise cessation - patch if needed  Anxiety and depression:  Stable.  She feels mood is stable despite being off medications since moving to [place]. She denies SI.  She has not yet established with local physician.   - will need to establish with PCP/psych after discharge  Diet:  Regular VTE ppx:  [place] lovenox, SCDs Code status:  Full  Dispo: Disposition is deferred at this time, awaiting improvement of current medical problems. Anticipated discharge in approximately 1 day(s).   The patient does not have a current  PCP (No Pcp Per Patient) and may need an [place] hospital follow-up appointment after discharge.  The patient does not have transportation limitations that hinder transportation to clinic appointments.  Signed: Yolanda Manges, DO 03/29/2016, 7:49 AM

## 2016-03-29 NOTE — ED Notes (Signed)
Pt having pain mid thoracic area, radiating through to chest. Dr. Andrey CampanileWilson made aware-- pt unable to go to [facility] bed that is assigned due to active chest pain.

## 2016-03-29 NOTE — ED Notes (Signed)
Pt sitting on side of bed, family at bedside.

## 2016-03-29 NOTE — Progress Notes (Signed)
Pt states that she DOES NOT go to pain clinic in the past.

## 2016-03-29 NOTE — ED Notes (Signed)
[facility] RN on [address]

## 2016-03-29 NOTE — H&P (Signed)
Date: 03/29/2016               Patient Name:  Crystal Baker MRN: [id number]  DOB: 1966/03/18 Age / Sex: 50 y.o., female   PCP: No Pcp Per Patient              Medical Service: Internal Medicine Teaching Service              Attending Physician: Dr. Inez Catalina, MD    First Contact: Danelle Earthly, MS4 Pager: [phone]  Second Contact: Dr. Gara Kroner Pager: [phone]            After Hours (After 5p/  First Contact Pager: [phone]  weekends / holidays): Second Contact Pager: [phone]   Chief Complaint: Chest pain and Syncope  History of Present Illness: Crystal Baker is a 50 year old woman with a history of recurrent syncope and anxiety/depression who presented to the ED with an episode of interscapular and chest pain and witnessed syncope.   At about 0130 this morning patient was awakened out of her sleep with a constant, sharp, 8-9/10 pain in her chest and back between her shoulder blades. Reports associated sweating, dizziness, nausea, and shortness of breath. Exacerbated by deep breathing. Denies radiation of pain, tachycardia or associated cough.   During this time her husband witnessed her "pass out" for several minutes.  Of what patient remembers, she was laying down bed when this this occurred.  Associated urinary incontinence but denies tongue bite, witness convulsions, or post syncopal confusion.    Patient continues to have similar discomfort but pain is now dull and 4-5/10 in intensity after 4mg  of morphine in the ED.  Patient has a history of severe childhood trauma that resulted in multiple complications including multiple neck and lower back surgeries and numerous syncopal episodes in the past that were precipitated pain. Has not had one of these episodes in the past 10 years.  Also has a history of depression and anxiety that were previously treated with Zoloft and Klonopin respectively. Currently on no prescribed medications. Takes OTC analgesics for chronic neck and back  pain.  Denies recent surgeries or travel. Denies personal or family history of coronary artery disease, diabetes, hypertension, liver or kidney disease, and hyperlipidemia.   Of note, patient moved from [place] to [place] 6 months ago and is a current 35 pack year smoker.    Meds: Current Facility-Administered Medications  Medication Dose Route Frequency Provider Last Rate Last Dose  . acetaminophen (TYLENOL) tablet 650 mg  650 mg Oral Q6H PRN Yolanda Manges, DO       Or  . acetaminophen (TYLENOL) suppository 650 mg  650 mg Rectal Q6H PRN Yolanda Manges, DO      . enoxaparin (LOVENOX) injection 40 mg  40 mg Subcutaneous Q24H Yolanda Manges, DO      . morphine 4 MG/ML injection 4 mg  4 mg Intravenous Q4H PRN Yolanda Manges, DO   4 mg at 03/29/16 1319  . sodium chloride flush (NS) 0.9 % injection 3 mL  3 mL Intravenous Q12H Yolanda Manges, DO   3 mL at 03/29/16 1245  . sodium chloride flush (NS) 0.9 % injection 3 mL  3 mL Intravenous PRN Yolanda Manges, DO        Allergies: Allergies as of 03/29/2016  . (No Known Allergies)   Past Medical History  Diagnosis Date  . Anxiety and depression   . Syncope   . Depression   .  Anxiety   . Arthritis    Past Surgical History  Procedure Laterality Date  . Neck surgery    . Back surgery     Family History  Problem Relation Age of Onset  . Cancer Other   . Heart disease Neg Hx    Social History   Social History  . Marital Status: Divorced    Spouse Name: N/A  . Number of Children: 2  . Years of Education: N/A   Occupational History  . unemplyed    Social History Main Topics  . Smoking status: Current Every Day Smoker -- 1.00 packs/day for 35 years    Types: Cigarettes  . Smokeless tobacco: Never Used  . Alcohol Use: No  . Drug Use: Yes    Special: Marijuana  . Sexual Activity: Not on file   Other Topics Concern  . Not on file   Social History Narrative    Review of Systems: Review of Systems  Constitutional: Negative for  fever, weight loss and malaise/fatigue.  HENT: Negative for hearing loss.   Eyes: Negative for blurred vision and double vision.  Respiratory: Negative for cough.   Gastrointestinal: Negative for heartburn, vomiting, abdominal pain, diarrhea and constipation.  Genitourinary: Negative for dysuria and frequency.  Skin: Negative for itching and rash.  Psychiatric/Behavioral: Negative for suicidal ideas and substance abuse.    Physical Exam: Blood pressure 103/67, pulse 63, temperature 97.7 F (36.5 C), temperature source Oral, resp. rate 16, height  (1.778 m), weight 76.386 kg (168 lb 6.4 oz), SpO2 97 %. BP 103/67 mmHg  Pulse 63  Temp(Src) 97.7 F (36.5 C) (Oral)  Resp 16  Ht  (1.778 m)  Wt 76.386 kg (168 lb 6.4 oz)  BMI 24.16 kg/m2  SpO2 97%  General Appearance:    Alert, cooperative, no distress, appears stated age  Head:    Normocephalic, without obvious abnormality, atraumatic  Eyes:    PERRL, conjunctiva/corneas clear  Throat:   Lips, mucosa, and tongue normal; teeth and gums normal  Neck:   Supple, symmetrical, trachea midline, no adenopathy;    thyroid:  no enlargement/tenderness/nodules; no carotid   bruit or JVD  Back:     No tenderness to palpation of spine, no CVA tenderness  Lungs:     Harsh breath sounds auscultated in right middle and lower lobes, respirations unlabored  Chest Wall:    No tenderness to palpation or deformity   Heart:    Regular rate and rhythm, S1 and S2 normal, no murmur, rub   or gallop  Abdomen:     Soft, non-tender, bowel sounds active all four quadrants,    no masses, no organomegaly  Extremities:   Extremities normal, atraumatic, no cyanosis or edema  Pulses:   2+ and symmetric all extremities  Skin:   Skin color, texture, hypopigmented skin legions on arms and chest (sequelae of childhood trauma)  Neurologic:   CNII-XII intact, normal strength, sensation and reflexes    throughout    Lab results: Basic Metabolic  Panel:  Recent Labs  03/29/16 0310  NA 139  K 3.9  CL 107  CO2 27  GLUCOSE 100*  BUN 10  CREATININE 0.93  CALCIUM 9.3   Liver Function Tests: No results for input(s): AST, ALT, ALKPHOS, BILITOT, PROT, ALBUMIN in the last 72 hours. No results for input(s): LIPASE, AMYLASE in the last 72 hours. No results for input(s): AMMONIA in the last 72 hours. CBC:  Recent Labs  03/29/16 0310  WBC 10.0  NEUTROABS 6.6  HGB 13.8  HCT 41.7  MCV 95.4  PLT 211   Cardiac Enzymes:  Recent Labs  03/29/16 0813 03/29/16 1326  TROPONINI <0.03 <0.03   BNP: No results for input(s): PROBNP in the last 72 hours. D-Dimer: No results for input(s): DDIMER in the last 72 hours. CBG: No results for input(s): GLUCAP in the last 72 hours. Hemoglobin A1C: No results for input(s): HGBA1C in the last 72 hours. Fasting Lipid Panel:  Recent Labs  03/29/16 1129  CHOL 174  HDL 63  LDLCALC 95  TRIG 80  CHOLHDL 2.8   Thyroid Function Tests: No results for input(s): TSH, T4TOTAL, FREET4, T3FREE, THYROIDAB in the last 72 hours. Anemia Panel: No results for input(s): VITAMINB12, FOLATE, FERRITIN, TIBC, IRON, RETICCTPCT in the last 72 hours. Coagulation: No results for input(s): LABPROT, INR in the last 72 hours. Urine Drug Screen: Drugs of Abuse     Component Value Date/Time   LABOPIA POSITIVE* 03/29/2016 0815   COCAINSCRNUR NONE DETECTED 03/29/2016 0815   LABBENZ NONE DETECTED 03/29/2016 0815   AMPHETMU NONE DETECTED 03/29/2016 0815   THCU POSITIVE* 03/29/2016 0815   LABBARB NONE DETECTED 03/29/2016 0815    Alcohol Level: No results for input(s): ETH in the last 72 hours. Urinalysis: No results for input(s): COLORURINE, LABSPEC, PHURINE, GLUCOSEU, HGBUR, BILIRUBINUR, KETONESUR, PROTEINUR, UROBILINOGEN, NITRITE, LEUKOCYTESUR in the last 72 hours.  Invalid input(s): APPERANCEUR  Imaging results:  Dg Chest 2 View  03/29/2016  CLINICAL DATA:  Awoke with central chest pain radiating to  the back. Shortness of breath. EXAM: CHEST  2 VIEW COMPARISON:  11/01/2015 FINDINGS: The cardiomediastinal contours are normal. Mild central bronchial thickening. Scattered linear atelectasis in both lower lung zones. Pulmonary vasculature is normal. No consolidation, pleural effusion, or pneumothorax. No acute osseous abnormalities are seen. Hardware in the cervical spine is partially included. IMPRESSION: Mild bronchitic change and scattered bibasilar atelectasis. Electronically Signed   By: Rubye OaksMelanie  Ehinger M.D.   On: 03/29/2016 03:03   Ct Angio Chest Aorta W/cm &/or Wo/cm  03/29/2016  CLINICAL DATA:  Initial evaluation for acute onset central chest pain radiating to upper back with shortness of breath. EXAM: CT ANGIOGRAPHY CHEST WITH CONTRAST TECHNIQUE: Multidetector CT imaging of the chest was performed using the standard protocol during bolus administration of intravenous contrast. Multiplanar CT image reconstructions and MIPs were obtained to evaluate the vascular anatomy. COMPARISON:  Prior radiograph from earlier the same day. FINDINGS: Thyroid gland normal. No pathologically enlarged mediastinal lymph nodes. Scattered soft tissue density within the bilateral hila likely reflects small subcentimeter lymph nodes. No pathologic hilar adenopathy. No axillary lymph nodes. Precontrast imaging of the aorta a demonstrates no acute abnormality. Minimal plaque at the origin of the left subclavian artery. Postcontrast imaging through the aorta at demonstrates no evidence for acute dissection or other acute abnormality. Aorta is of normal caliber without aneurysm. Visualized great vessels are normal. No mediastinal hematoma or other abnormality. Re-formatted imaging confirms these findings. Heart size normal. No significant coronary artery calcifications. No pericardial effusion. Limited evaluation of the pulmonary arteries grossly unremarkable. Lungs are clear without focal infiltrate or pulmonary edema. No  pleural effusion. No pneumothorax. Upper lobe predominant bronchitic changes with probable air trapping. Cystic lucencies within the anterior aspect of the upper lobes also likely related to bronchiectasis. No worrisome pulmonary nodule or mass. Visualized upper abdomen within normal limits without acute abnormality. Possible small 11 mm adenoma at the left adrenal gland noted. No acute osseous  abnormality. No worrisome lytic or blastic osseous lesions. ACDF partially visualized within the cervical spine. IMPRESSION: 1. No CTA evidence for acute aortic dissection or other acute aortic pathology. 2. No other acute abnormality within this chest. 3. Upper lobe predominant bronchiectasis with associated air trapping. 4. Minimal atheromatous plaque at the origin of the left subclavian artery. No other significant atheromatous disease within the thorax. Electronically Signed   By: Rise Mu M.D.   On: 03/29/2016 05:33    Other results: EKG (0221):  Sinus rhythm, borderline prolonged PR interval, minimal ST elevations in V1 and V with , inverted T waves.  No significant change from previous tracing on 11/01/2015   Problems Principal Problem:   Syncope Active Problems:   Chest pain   Tobacco use disorder   Interscapular pain  Assessment & Plan by Problem: Syncope: Patient has had history recurrent syncope as well as a history of anxiety.  Differential includes cardiogenic vs. Vasovagal etiology.   Arrhythmia is most concerning and should be ruled out.  Although she has no history of heart defects, sudden palpitations or family history of unexplained sudden death, this episode of syncope was acute in nature with associated chest pain.  EKG findings concerning for possible brugada syndrome.  Vasovagal etiology should be considered as syncope precipitated by dizziness, diaphoresis and chest pain. -- Admit for Telemetry monitoring   -- 2D Echocardiogram to rule out valvular abnormalities -- repeat  EKG  Chest Pain/interscapular pain:  Patient's chest pain is acute and related to concurrent interscapular pain.  Differential includes cardiac vs. MSK etiology. Aortic dissection was most concerning but ruled out with negative CTA and normal peripheral pulses. Cardiac ischemia is also not likely due to negative EKG and negative cardiac enzymes.  Arrhythmia cannot be ruled out as patient had an associated syncopal episode.    Musculoskeletal etiology is less likely as pain is not reproducible.    Although patients pain is pleuritic in nature, PE is not likely due to negative CTA, no history of immobilization and no signs of symptoms of DVT   Low clinical suspicion for -- continue to trend troponins -- PRN morphine and acetaminophen for pain control  Tobacco Use Disorder:  35 pack year smoker -- counsel on smoking cessation  Prophylaxis -- DVT: Lovenox 40mg  q24hr  Diet: Regular  Code status:  FULL CODE  Dispo: Pending medical improvement  This is a Psychologist, occupational Note.  The care of the patient was discussed with Dr. Algis Greenhouse and the assessment and plan was formulated with their assistance.  Please see their note for official documentation of the patient encounter.   Signed: Danelle Earthly, Med Student 03/29/2016, 4:54 PM

## 2016-03-30 ENCOUNTER — Other Ambulatory Visit (HOSPITAL_COMMUNITY): Payer: Self-pay

## 2016-03-30 ENCOUNTER — Other Ambulatory Visit: Payer: Self-pay | Admitting: Cardiology

## 2016-03-30 ENCOUNTER — Observation Stay (HOSPITAL_BASED_OUTPATIENT_CLINIC_OR_DEPARTMENT_OTHER): Payer: MEDICAID

## 2016-03-30 DIAGNOSIS — F17200 Nicotine dependence, unspecified, uncomplicated: Secondary | ICD-10-CM

## 2016-03-30 DIAGNOSIS — R55 Syncope and collapse: Secondary | ICD-10-CM

## 2016-03-30 DIAGNOSIS — R079 Chest pain, unspecified: Secondary | ICD-10-CM

## 2016-03-30 DIAGNOSIS — M5489 Other dorsalgia: Secondary | ICD-10-CM

## 2016-03-30 DIAGNOSIS — R072 Precordial pain: Secondary | ICD-10-CM

## 2016-03-30 LAB — ECHOCARDIOGRAM COMPLETE
HEIGHTINCHES: 70 in
Weight: 2694.4 oz

## 2016-03-30 LAB — HEMOGLOBIN A1C
Hgb A1c MFr Bld: 5.4 % (ref 4.8–5.6)
Mean Plasma Glucose: 108 mg/dL

## 2016-03-30 MED ORDER — CYCLOBENZAPRINE HCL 5 MG PO TABS
7.5000 mg | ORAL_TABLET | Freq: Three times a day (TID) | ORAL | Status: DC | PRN
  Filled 2016-03-30: qty 1.5

## 2016-03-30 MED ORDER — TRAMADOL HCL 50 MG PO TABS
50.0000 mg | ORAL_TABLET | Freq: Four times a day (QID) | ORAL | Status: DC | PRN
  Administered 2016-03-30: 50 mg via ORAL
  Filled 2016-03-30: qty 1

## 2016-03-30 NOTE — Consult Note (Signed)
CARDIOLOGY CONSULT NOTE   Patient ID: Crystal Baker MRN: [id number] DOB/AGE: 1966/01/14 50 y.o.  Admit date: 03/29/2016  Primary Physician   No PCP Per Patient Primary Cardiologist: New Requesting MD: Dr. Algis Greenhouse Reason for Consultation: chest pain  HPI: Ms. Crystal Baker is a 50 year old female with PMH of  anxiety and depression, syncope, and tobacco abuse. No prior history of CAD.   She was awakened from sleep at 2 AM with sharp pain interscapular pain. She reports that her chest began to hurt when she took a deep breath. She got up, and felt nauseous and had syncopal episode. When she woke up she continued to have sharp interscapular pain and came to the emergency room. Pain has persisted since arrival. It is associated with shortness of breath and intermittent diaphoresis. She does not have substernal chest pain at rest, but says that her chest hurts only when she takes a deep breath.  She smokes one pack per day, 35 pack year smoker. She denies alcohol use and illicit drug use. However her urine drug screen is positive for marijuana.   She is active at home, working out at the gym 5 days per week. She has noticed that her energy has been reduced over the past 2 weeks. She's never had a pain like this before. No history of heart disease in her family. She recently relocated from [place].  Currently she endorses the interscapular pain, chest pain with inspiration. Denies shortness of breath at rest.    Past Medical History  Diagnosis Date  . Anxiety and depression   . Syncope   . Depression   . Anxiety   . Arthritis      Past Surgical History  Procedure Laterality Date  . Neck surgery    . Back surgery      No Known Allergies  I have reviewed the patient's current medications . enoxaparin (LOVENOX) injection  40 mg Subcutaneous Q24H  . sodium chloride flush  3 mL Intravenous Q12H     acetaminophen **OR** acetaminophen, morphine injection, sodium chloride  flush  Prior to Admission medications   Medication Sig Start Date End Date Taking? Authorizing Provider  naproxen (NAPROSYN) 500 MG tablet Take 1 tablet (500 mg total) by mouth 2 (two) times daily. Patient not taking: Reported on 03/29/2016 03/23/16   Ace Gins Sam, PA-C  predniSONE (DELTASONE) 20 MG tablet Take 2 tablets (40 mg total) by mouth daily. Patient not taking: Reported on 03/29/2016 03/23/16   Carlene Coria, PA-C     Social History   Social History  . Marital Status: Divorced    Spouse Name: N/A  . Number of Children: 2  . Years of Education: N/A   Occupational History  . unemplyed    Social History Main Topics  . Smoking status: Current Every Day Smoker -- 1.00 packs/day for 35 years    Types: Cigarettes  . Smokeless tobacco: Never Used  . Alcohol Use: No  . Drug Use: Yes    Special: Marijuana  . Sexual Activity: Not on file   Other Topics Concern  . Not on file   Social History Narrative    No family status information on file.   Family History  Problem Relation Age of Onset  . Cancer Other   . Heart disease Neg Hx      ROS:  Full 14 point review of systems complete and found to be negative unless listed above.  Physical Exam: Blood pressure  108/68, pulse 64, temperature 98 F (36.7 C), temperature source Oral, resp. rate 20, he [id number]$  (1.778 m), weight 168 lb 6.4 oz (76.386 kg), SpO2 100 %.  General: Well developed, well nourished, female in no acute distress Head: Eyes PERRLA, No xanthomas.   Normocephalic and atraumatic, oropharynx without edema or exudate. Lungs: CTA Heart: HRRR S1 S2, no rub/gallop,no murmur. pulses are 2+ extrem.   Neck: No carotid bruits. No lymphadenopathy.  No JVD. Abdomen: Bowel sounds present, abdomen soft and non-tender without masses or hernias noted. Msk:  No spine or cva tenderness. No weakness, no joint deformities or effusions. Extremities: No clubbing or cyanosis. no edema.  Neuro: Alert and oriented X 3. No focal  deficits noted. Psych:  Good affect, responds appropriately Skin: No rashes or lesions noted.  Labs:   Lab Results  Component Value Date   WBC 10.0 03/29/2016   HGB 13.8 03/29/2016   HCT 41.7 03/29/2016   MCV 95.4 03/29/2016   PLT 211 03/29/2016    Recent Labs Lab 03/29/16 0310  NA 139  K 3.9  CL 107  CO2 27  BUN 10  CREATININE 0.93  CALCIUM 9.3  GLUCOSE 100*    Recent Labs  03/29/16 0813 03/29/16 1326 03/29/16 1954  TROPONINI <0.03 <0.03 <0.03    Recent Labs  03/29/16 0323  TROPIPOC 0.00   No results found for: BNP Lab Results  Component Value Date   CHOL 174 03/29/2016   HDL 63 03/29/2016   LDLCALC 95 03/29/2016   TRIG 80 03/29/2016    Echo: pending  ECG:  Sinus brady, T wave inversion in V1 and V2, noted on EKG from Jan. 2017.   Radiology:  Dg Chest 2 View  03/29/2016  CLINICAL DATA:  Awoke with central chest pain radiating to the back. Shortness of breath. EXAM: CHEST  2 VIEW COMPARISON:  11/01/2015 FINDINGS: The cardiomediastinal contours are normal. Mild central bronchial thickening. Scattered linear atelectasis in both lower lung zones. Pulmonary vasculature is normal. No consolidation, pleural effusion, or pneumothorax. No acute osseous abnormalities are seen. Hardware in the cervical spine is partially included. IMPRESSION: Mild bronchitic change and scattered bibasilar atelectasis. Electronically Signed   By: Rubye Oaks M.D.   On: 03/29/2016 03:03   Ct Angio Chest Aorta W/cm &/or Wo/cm  03/29/2016  CLINICAL DATA:  Initial evaluation for acute onset central chest pain radiating to upper back with shortness of breath. EXAM: CT ANGIOGRAPHY CHEST WITH CONTRAST TECHNIQUE: Multidetector CT imaging of the chest was performed using the standard protocol during bolus administration of intravenous contrast. Multiplanar CT image reconstructions and MIPs were obtained to evaluate the vascular anatomy. COMPARISON:  Prior radiograph from earlier the same  day. FINDINGS: Thyroid gland normal. No pathologically enlarged mediastinal lymph nodes. Scattered soft tissue density within the bilateral hila likely reflects small subcentimeter lymph nodes. No pathologic hilar adenopathy. No axillary lymph nodes. Precontrast imaging of the aorta a demonstrates no acute abnormality. Minimal plaque at the origin of the left subclavian artery. Postcontrast imaging through the aorta at demonstrates no evidence for acute dissection or other acute abnormality. Aorta is of normal caliber without aneurysm. Visualized great vessels are normal. No mediastinal hematoma or other abnormality. Re-formatted imaging confirms these findings. Heart size normal. No significant coronary artery calcifications. No pericardial effusion. Limited evaluation of the pulmonary arteries grossly unremarkable. Lungs are clear without focal infiltrate or pulmonary edema. No pleural effusion. No pneumothorax. Upper lobe predominant bronchitic changes with probable air trapping. Cystic lucencies  within the anterior aspect of the upper lobes also likely related to bronchiectasis. No worrisome pulmonary nodule or mass. Visualized upper abdomen within normal limits without acute abnormality. Possible small 11 mm adenoma at the left adrenal gland noted. No acute osseous abnormality. No worrisome lytic or blastic osseous lesions. ACDF partially visualized within the cervical spine. IMPRESSION: 1. No CTA evidence for acute aortic dissection or other acute aortic pathology. 2. No other acute abnormality within this chest. 3. Upper lobe predominant bronchiectasis with associated air trapping. 4. Minimal atheromatous plaque at the origin of the left subclavian artery. No other significant atheromatous disease within the thorax. Electronically Signed   By: Rise MuBenjamin  McClintock M.D.   On: 03/29/2016 05:33    ASSESSMENT AND PLAN:    Principal Problem:   Syncope Active Problems:   Chest pain   Tobacco use disorder    Interscapular pain  1. Atypical chest pain: Patient reports substernal chest pain with inspiration, worse with palpation. Her main compliant is interscapular pain, which could be her anginal equivalent but her pain seems more musculoskeletal in nature. Her troponin is negative x 3, EKG not concerning for ischemia. No need for ischemic evaluation at this time, will await results of echo to assess for wall motion abnormalities. LDL is 95, A1C is 5.4.   2. Tobacco abuse: Encouraged smoking cessation.  3. Syncope: Patient reports that she has had syncopal episodes in the past. She has not had one in over 10 years. Review of telemetry shows sinus bradycardia with rates in the upper 40's to 50's. Can consider having patient wear 30 day monitor and outpatient setting.  Signed: Little IshikawaErin E Smith, NP 03/30/2016 7:46 AM Pager [phone]   I have seen and examined the patient along with Little IshikawaErin E Smith, NP.  I have reviewed the chart, notes and new data.  I agree with NP's note.  Key new complaints: symptoms are atypical and variably described; risk factor profile is low risk (smoking) Key examination changes: normal CV exam, pain appears to be reproducible with palpation Key new findings / data: ECG, cardiac enzymes are normal, chest CTA does not show abnormalities of the thoracic aorta, pulmonary embolism or any calcification in the coronaries.  PLAN: Low suspicion for acute coronary syndrome. Suspect symptoms may be related to spine problems. Outpatient treadmill stress test appears appropriate. Event monitor for possible syncope.  Thurmon FairMihai Zhaniya Swallows, MD, [facility] [phone] 03/30/2016, 10:25 AM

## 2016-03-30 NOTE — Discharge Summary (Signed)
Name: Crystal SavoyKimberly Baker MRN: [id number] DOB: 1966/01/16 50 y.o. PCP: No Pcp Per Patient  Date of Admission: 03/29/2016  2:31 AM Date of Discharge: 03/30/2016 Attending Physician: Inez CatalinaEmily B Mullen, MD  Discharge Diagnosis: Principal Problem:   Syncope Active Problems:   Chest pain   Tobacco use disorder   Interscapular pain  Discharge Medications:   Medication List    STOP taking these medications        predniSONE 20 MG tablet  Commonly known as:  DELTASONE      TAKE these medications        naproxen 500 MG tablet  Commonly known as:  NAPROSYN  Take 1 tablet (500 mg total) by mouth 2 (two) times daily.        Disposition and follow-up:   [facility]  At the hospital follow up visit please address:  1.  Please follow up with cardiology for exercise treadmill test and to get set up with 30 day event monitor.   2.  Labs / imaging needed at time of follow-up: none  3.  Pending labs/ test needing follow-up: none  Follow-up Appointments:     Follow-up Information    Follow up with [facility]. Go on 04/26/2016.   Specialty:  Internal Medicine   Why:  09:30; Please call and reschedule if you are unable to make this appointment.    Contact information:   [address] [place] [id number] [phone]         Consultations: Treatment Team:  Rounding Lbcardiology, MD  Procedures Performed:  Dg Chest 2 View  03/29/2016  CLINICAL DATA:  Awoke with central chest pain radiating to the back. Shortness of breath. EXAM: CHEST  2 VIEW COMPARISON:  11/01/2015 FINDINGS: The cardiomediastinal contours are normal. Mild central bronchial thickening. Scattered linear atelectasis in both lower lung zones. Pulmonary vasculature is normal. No consolidation, pleural effusion, or pneumothorax. No acute osseous abnormalities are seen. Hardware in the cervical spine is partially  included. IMPRESSION: Mild bronchitic change and scattered bibasilar atelectasis. Electronically Signed   By: Rubye OaksMelanie  Ehinger M.D.   On: 03/29/2016 03:03   Ct Angio Chest Aorta W/cm &/or Wo/cm  03/29/2016  CLINICAL DATA:  Initial evaluation for acute onset central chest pain radiating to upper back with shortness of breath. EXAM: CT ANGIOGRAPHY CHEST WITH CONTRAST TECHNIQUE: Multidetector CT imaging of the chest was performed using the standard protocol during bolus administration of intravenous contrast. Multiplanar CT image reconstructions and MIPs were obtained to evaluate the vascular anatomy. COMPARISON:  Prior radiograph from earlier the same day. FINDINGS: Thyroid gland normal. No pathologically enlarged mediastinal lymph nodes. Scattered soft tissue density within the bilateral hila likely reflects small subcentimeter lymph nodes. No pathologic hilar adenopathy. No axillary lymph nodes. Precontrast imaging of the aorta a demonstrates no acute abnormality. Minimal plaque at the origin of the left subclavian artery. Postcontrast imaging through the aorta at demonstrates no evidence for acute dissection or other acute abnormality. Aorta is of normal caliber without aneurysm. Visualized great vessels are normal. No mediastinal hematoma or other abnormality. Re-formatted imaging confirms these findings. Heart size normal. No significant coronary artery calcifications. No pericardial effusion. Limited evaluation of the pulmonary arteries grossly unremarkable. Lungs are clear without focal infiltrate or pulmonary edema. No pleural effusion. No pneumothorax. Upper lobe predominant bronchitic changes with probable air trapping. Cystic lucencies within the anterior aspect of the upper lobes  also likely related to bronchiectasis. No worrisome pulmonary nodule or mass. Visualized upper abdomen within normal limits without acute abnormality. Possible small 11 mm adenoma at the left adrenal gland noted. No acute  osseous abnormality. No worrisome lytic or blastic osseous lesions. ACDF partially visualized within the cervical spine. IMPRESSION: 1. No CTA evidence for acute aortic dissection or other acute aortic pathology. 2. No other acute abnormality within this chest. 3. Upper lobe predominant bronchiectasis with associated air trapping. 4. Minimal atheromatous plaque at the origin of the left subclavian artery. No other significant atheromatous disease within the thorax. Electronically Signed   By: Rise Mu M.D.   On: 03/29/2016 05:33    2D Echo: 03/30/2016 Study Conclusions  - Left ventricle: Posterior basal hypokinesis. The cavity size was  mildly dilated. Wall thickness was increased in a pattern of mild  LVH. Systolic function was normal. The estimated ejection  fraction was in the range of 50% to 55%. - Atrial septum: redundant and mobile atrial septum no obvious PFO.   Admission HPI: Crystal Baker is a 50 year old woman with PMH of syncope (childhood and young adulthood) and anxiety/depression here with acute c/o interscapular back pain and chest pain that awoke her from sleep last night. She describes pain as 8/10, sharp and constant, located directly between shoulder blades and radiated into chest without radiation into arms or neck, worse with breathing, not positional. There was associated diaphoresis and dyspnea. She sat up in bed and her husband awoke. She then felt lightheaded and reports LOC for several minutes. She denies tongue biting or tonic-clonic movement but says she urinated on herself. When she awoke she was not confused. She denies palpitations, acute neck pain, visual change, reflux or anxiety during the event. She was seen in the ED 4 months ago for right sided pleuritic chest pain but she says this feels different. She reports being in her usual state of health prior to onset of symptoms last night. She denies change in activity yesterday. She does report  occasional dyspnea (sometimes at rest, with exertion and laying flat) but does not usually have chest pain. She denies LE pain/swelling, recent surgery or long car/plane rides. She reports recurrent syncope in childhood and young adulthood (related to pain) that were attributed to significant traumas she experienced in childhood. She says she has never been told she has any cardiac problems and denies hx of HTN, HLD, DM. She has not felt lightheaded or passed out in over 10 years. There is no personal hx of syncope with exertion. There is no family hx of sudden cardiac death.   She has smoked 1PPD x 35 years but denies illicit drug use or EtOH abuse. She denies current everyday med use but does take OTC NSAIDS on occasion for chronic MSK pain from prior trauma and surgeries. She was previously prescribed Klonopin and Zoloft for anxiety/depression but has been off of these medications for awhile since moving to [place] from [facility] 6 months ago. Chest and back pain are better in the ED, 5/10, after receiving IV pain med.   Hospital Course by problem list: Principal Problem:   Syncope Active Problems:   Chest pain   Tobacco use disorder   Interscapular pain   Syncope--On presentation to the ED, CTA and CXR were negative for acute aortic and cardiopulmonary pathology.  CBC and BMP were within normal limits except for a slightly elevated glucose of 100.  POC Troponin I level was 0.00.   Patient given  4mg  of morphine which improved her pain.   EKGs were trended as initial EKG showed concerning ST elevation V1 and V2 leads.  2D echo WNL.  UDS positive for THC.  Lipid panel and HbA1c within normal limits. The following morning (03/30/2016), patient continued to have dull, achy chest and interscapular pain but upon further examination pain was reproducible.  Back pain localized to the distribution of the trapezius muscle.  Characterization of pain suggested MSK etiology.  PRN oral pain medication and  muscle relaxers were ordered.  Subsequent EKGs were stable and negative for ischemic processes and troponins x3 remained negative. Cardiology evaluated pt on 03/30/2016 and recommended 30 day Holter monitor and an exercise stress test. Cardiology clinic to call pt for an appt to set this up.   Discharge Vitals:   BP 108/68 mmHg  Pulse 64  Temp(Src) 98 F (36.7 C) (Oral)  Resp 20  Ht 5\' 10"  (1.778 m)  Wt 168 lb 6.4 oz (76.386 kg)  BMI 24.16 kg/m2  SpO2 100%  Discharge Labs:  Results for orders placed or performed during the hospital encounter of 03/29/16 (from the past 24 hour(s))  Troponin I (q 6hr x 3)     Status: None   Collection Time: 03/29/16  7:54 PM  Result Value Ref Range   Troponin I <0.03 <0.031 ng/mL    Signed: Denton Brick, MD 03/30/2016, 2:45 PM    Services Ordered on Discharge: none Equipment Ordered on Discharge: none

## 2016-03-30 NOTE — Progress Notes (Signed)
Subjective: Pt states chest pain has improved but she is still having pain around her trapezius muscle.   Objective: Vital signs in last 24 hours: Filed Vitals:   03/29/16 1252 03/29/16 2100 03/30/16 0050 03/30/16 0510  BP:  109/72 103/69 108/68  Pulse:  100 95 64  Temp:  97.7 F (36.5 C) 97.9 F (36.6 C) 98 F (36.7 C)  TempSrc:  Oral Oral Oral  Resp:  20 20 20   Height: 5\' 10"  (1.778 m)     Weight: 168 lb 6.4 oz (76.386 kg)     SpO2:  100% 100% 100%   Weight change: 15 lb 6.4 oz (6.985 kg)  Intake/Output Summary (Last 24 hours) at 03/30/16 1040 Last data filed at 03/30/16 0800  Gross per 24 hour  Intake      0 ml  Output      0 ml  Net      0 ml   General: NAD, laying in bed comfortably Lungs: CTAB, no wheezing Cardiac: RRR, no murmurs, rubs, or gallops. + for chest wall tenderness but does not reproduce CP from admission GI: soft, active bowel sounds, non TTP Neuro: CN II-XII grossly intact Skin: warm and dry  Lab Results: Basic Metabolic Panel:  Recent Labs Lab 03/29/16 0310  NA 139  K 3.9  CL 107  CO2 27  GLUCOSE 100*  BUN 10  CREATININE 0.93  CALCIUM 9.3   CBC:  Recent Labs Lab 03/29/16 0310  WBC 10.0  NEUTROABS 6.6  HGB 13.8  HCT 41.7  MCV 95.4  PLT 211   Cardiac Enzymes:  Recent Labs Lab 03/29/16 0813 03/29/16 1326 03/29/16 1954  TROPONINI <0.03 <0.03 <0.03   Hemoglobin A1C:  Recent Labs Lab 03/29/16 1129  HGBA1C 5.4   Fasting Lipid Panel:  Recent Labs Lab 03/29/16 1129  CHOL 174  HDL 63  LDLCALC 95  TRIG 80  CHOLHDL 2.8   Urine Drug Screen: Drugs of Abuse     Component Value Date/Time   LABOPIA POSITIVE* 03/29/2016 0815   COCAINSCRNUR NONE DETECTED 03/29/2016 0815   LABBENZ NONE DETECTED 03/29/2016 0815   AMPHETMU NONE DETECTED 03/29/2016 0815   THCU POSITIVE* 03/29/2016 0815   LABBARB NONE DETECTED 03/29/2016 0815     Studies/Results: Dg Chest 2 View  03/29/2016  CLINICAL DATA:  Awoke with central  chest pain radiating to the back. Shortness of breath. EXAM: CHEST  2 VIEW COMPARISON:  11/01/2015 FINDINGS: The cardiomediastinal contours are normal. Mild central bronchial thickening. Scattered linear atelectasis in both lower lung zones. Pulmonary vasculature is normal. No consolidation, pleural effusion, or pneumothorax. No acute osseous abnormalities are seen. Hardware in the cervical spine is partially included. IMPRESSION: Mild bronchitic change and scattered bibasilar atelectasis. Electronically Signed   By: Rubye OaksMelanie  Ehinger M.D.   On: 03/29/2016 03:03   Ct Angio Chest Aorta W/cm &/or Wo/cm  03/29/2016  CLINICAL DATA:  Initial evaluation for acute onset central chest pain radiating to upper back with shortness of breath. EXAM: CT ANGIOGRAPHY CHEST WITH CONTRAST TECHNIQUE: Multidetector CT imaging of the chest was performed using the standard protocol during bolus administration of intravenous contrast. Multiplanar CT image reconstructions and MIPs were obtained to evaluate the vascular anatomy. COMPARISON:  Prior radiograph from earlier the same day. FINDINGS: Thyroid gland normal. No pathologically enlarged mediastinal lymph nodes. Scattered soft tissue density within the bilateral hila likely reflects small subcentimeter lymph nodes. No pathologic hilar adenopathy. No axillary lymph nodes. Precontrast imaging of the aorta a  demonstrates no acute abnormality. Minimal plaque at the origin of the left subclavian artery. Postcontrast imaging through the aorta at demonstrates no evidence for acute dissection or other acute abnormality. Aorta is of normal caliber without aneurysm. Visualized great vessels are normal. No mediastinal hematoma or other abnormality. Re-formatted imaging confirms these findings. Heart size normal. No significant coronary artery calcifications. No pericardial effusion. Limited evaluation of the pulmonary arteries grossly unremarkable. Lungs are clear without focal infiltrate or  pulmonary edema. No pleural effusion. No pneumothorax. Upper lobe predominant bronchitic changes with probable air trapping. Cystic lucencies within the anterior aspect of the upper lobes also likely related to bronchiectasis. No worrisome pulmonary nodule or mass. Visualized upper abdomen within normal limits without acute abnormality. Possible small 11 mm adenoma at the left adrenal gland noted. No acute osseous abnormality. No worrisome lytic or blastic osseous lesions. ACDF partially visualized within the cervical spine. IMPRESSION: 1. No CTA evidence for acute aortic dissection or other acute aortic pathology. 2. No other acute abnormality within this chest. 3. Upper lobe predominant bronchiectasis with associated air trapping. 4. Minimal atheromatous plaque at the origin of the left subclavian artery. No other significant atheromatous disease within the thorax. Electronically Signed   By: Rise Mu M.D.   On: 03/29/2016 05:33   Medications: I have reviewed the patient's current medications. Scheduled Meds: . enoxaparin (LOVENOX) injection  40 mg Subcutaneous Q24H  . sodium chloride flush  3 mL Intravenous Q12H   Continuous Infusions:  PRN Meds:.acetaminophen **OR** acetaminophen, cyclobenzaprine, sodium chloride flush, traMADol Assessment/Plan: Principal Problem:   Syncope Active Problems:   Chest pain   Tobacco use disorder   Interscapular pain   Assessment & Plan by Problem: 50 year old woman with PMH of syncope (childhood and young adulthood) and anxiety/depression here with acute c/o interscapular back pain and chest pain that awoke her from sleep last night.   Syncope--likely vasovagal from pain vs arrhythmia. Orthostatic vitals negative. Could also be from brugada syndrome as she had ST elevations in V1 and V2 in possible type 2 or 3 pattern however this would be extremely rare.  - ECHO ordered - cardiology following, appreciate their recommendations: outpatient  treadmill stress test and 30 day event monitor.   Chest and back pain:troponins negative x 3. EKG w/ questionable ST changes in V1 and V2. A1c 5.4 and LDL 95. Unlikely ACS, likely MSK as pain is located along her trapezius muscle and yesterday reported that chest pain and back pain were similar and associated w/ one another.  - 2D ECHO - d/c'd morphine, started on tramadol  q6h prn and flexeril 7.5mg  TID prn for pain. If needed can give additional morphine for pain control.   Tobacco use disorder: 1PPD x 35 years.  - advise cessation - patch if needed  Diet: Regular VTE ppx: [place] lovenox, SCDs Code status: Full  Dispo: Disposition is deferred at this time, awaiting improvement of current medical problems.    The patient does not have a current PCP (No Pcp Per Patient) and does not know need an [facility] hospital follow-up appointment after discharge.  The patient does not have transportation limitations that hinder transportation to clinic appointments.  .Services Needed at time of discharge: Y = Yes, Blank = No PT:   OT:   RN:   Equipment:   Other:       Denton Brick, MD 03/30/2016, 10:40 AM

## 2016-03-30 NOTE — Progress Notes (Signed)
  Echocardiogram 2D Echocardiogram has been performed.  Janalyn HarderWest, Aziz Slape R 03/30/2016, 11:37 AM

## 2016-03-30 NOTE — Progress Notes (Signed)
Subjective: No acute events overnight. Continues to have chest and back pain but characterized as dull and achy compared to initial presentation.  Improved pain control with PRN medication.  Denies sweats, dizziness and nausea.  Heart rate on telemetry showed patient was bradycardic to the upper 40s and 50s but patient asymptomatic overnight    Seen by cardiology this morning  Objective: Vital signs in last 24 hours: Filed Vitals:   03/29/16 1252 03/29/16 2100 03/30/16 0050 03/30/16 0510  BP:  109/72 103/69 108/68  Pulse:  100 95 64  Temp:  97.7 F (36.5 C) 97.9 F (36.6 C) 98 F (36.7 C)  TempSrc:  Oral Oral Oral  Resp:  20 20 20   Height: 5\' 10"  (1.778 m)     Weight: 76.386 kg (168 lb 6.4 oz)     SpO2:  100% 100% 100%   Weight change: 6.985 kg (15 lb 6.4 oz)  General appearance: No acute distress.  Alert and oriented.  Laying in bed.  Cooperative  Lungs: clear to auscultation bilaterally Heart: regular rate and rhythm, S1, S2 normal, no murmur, click, rub or gallop Pulses: 2+ and symmetric bilaterally MSK:  Chest pain reproduced upon palpation of left chest wall.  Back pain reproduced when lifting upper extremity in the distribution of the trapezius muscle.   Lab Results: Lipid Profile:  Cholesterol: 174, TGs: 80, HDL: 63, LDL: 95, VLDL: 16, Total CHOL/HDL Ratio: 2.8  Hbg A1C: 5.4  Troponin I : < 0.03 --> < 0.03 --> < 0.03  Studies/Results: EKG (1914(0959) - 03/29/16: normal sinus rhythm (72 BPM), PR interval 202, QRS duration 102ms. Minimal SST elevations in V1 and V2 with inverted T waves EKG (1355) - 03/29/16: sinues bradycardia (52 BPM), PR interval 172, QRS duration 96ms. Slower HR than previous EKG EKG (0510) - 03/30/16:  sinus bradycardia (54 BPM), PR interval 174, QRS duration 96ms.  No significant change from previous EKG  Medications:  Scheduled: . enoxaparin (LOVENOX) injection  40 mg Subcutaneous Q24H  . sodium chloride flush  3 mL Intravenous Q12H    NWG:[id number]:acetaminophen **OR** acetaminophen, cyclobenzaprine, sodium chloride flush, traMADol Scheduled Meds: . enoxaparin (LOVENOX) injection  40 mg Subcutaneous Q24H  . sodium chloride flush  3 mL Intravenous Q12H   Continuous Infusions:  PRN Meds:.acetaminophen **OR** acetaminophen, cyclobenzaprine, sodium chloride flush, traMADol   Problem List: Principal Problem:   Syncope Active Problems:   Chest pain   Tobacco use disorder   Interscapular pain  Assessment/Plan: Ms. Crystal Baker is a 50 year old woman with a history of chronic pain, depression/anxiety and PMH of syncopal episodes admitted for chest pain and syncope workup after presenting to the ED with acute neck and chest pain followed by an episode of syncope  1. Syncope: Concern for cardiac source (arrythmia, cardiac structural defect) due to acute nature of syncopal episode and multiple recurrent episodes of syncope in the past.  Telemetry last night showed sinus bradycardia. Minimal SST elevations in V1 and V2 with inverted T waves consistent on EKGx3.  Cardiac monitoring should be continued to assess for rhythm and rate changes.  Orthostatic etiology should be ruled out as prodrome of sweating. dizziness and nausea preceded initial presenting episode. Cardiology is following. --  Continue monitoring cardiac activity with telemetry -- 2D echo today to rule out structural cardiac abnormalities --  Consider outpatient 30 day monitor in the outpatient setting per cardiology -- order orthostatic vitals  2. Chest pain/Interscapular pain:  Reproducibility and distrubution of the pain in the  setting of chronic pain issues suggests MSK etiology.  Low clinical suspicion for ischemic cardiac etiology. Pain improved with PRN meds.  Pain medications should be continued but switched from IV to oral as discharge is likely.  A PRN muscle relaxer will be added to for better control of MSK pain.  No need for further ischemic evaluation at this time due  to lack of ischemic changes on EKG and negative troponin x3 per cardiology. --  D/c IV morphine  q4h PRN -- begin tramodal  q6h PRN -- begin Flexeril 7.5mg  BID PRN  3. Tobacco Use Disorder -- cessation encouraged  PPX:   --DVT PPX: Lovenox  q24h  Diet: regular  Code Status: FULL CODE  Dispo: pending medical improvement with follow-up with a PCP.  Patient has yet to establish care with a PCP as patient has recently morve to the area.    This is a Psychologist, occupational Note.  The care of the patient was discussed with Dr. Vivianne Master and the assessment and plan formulated with their assistance.  Please see their attached note for official documentation of the daily encounter.    Crystal Baker, Med Student 03/30/2016, 10:26 AM

## 2016-03-30 NOTE — Care Management Note (Signed)
Case Management Note  Patient Details  Name: Crystal Baker MRN: [id number] Date of Birth: [birth year]  Subjective/Objective: 50 y.o. F admitted  03/29/2016 for CP and Syncope. All evaluations completed today and pt is to be discharged with no CM needs other than PCP referral. Pt reports she lives in single family residence with spouse and has transportation to [facility], [facility] [address] 04/26/2016 @ 0930. She is aware than she will need to call and cancel if unable to attend. No Insurance at present, but states she can afford meds. Gave her Good Rx web site and [place].  she voiced no questions.                    Action/Plan: CM will sign off for now but will be available should additional discharge  needs arise.    Expected Discharge Date:                  Expected Discharge Plan:  Home/Self Care  In-House Referral:  PCP / [facility]  Discharge planning Services  CM Consult  Post Acute Care Choice:    Choice offered to:  Patient  DME Arranged:    DME Agency:     HH Arranged:    HH Agency:     Status of Service:  Completed, signed off  Medicare Important Message Given:    Date Medicare IM Given:    Medicare IM give by:    Date Additional Medicare IM Given:    Additional Medicare Important Message give by:     If discussed at Long Length of Stay Meetings, dates discussed:    Additional Comments:  Yvone NeuCrutchfield, [name], RN 03/30/2016, 2:33 PM

## 2016-04-26 ENCOUNTER — Ambulatory Visit: Payer: Self-pay | Admitting: Family Medicine

## 2016-08-17 ENCOUNTER — Encounter (HOSPITAL_COMMUNITY): Payer: Self-pay | Admitting: Emergency Medicine

## 2016-08-17 ENCOUNTER — Emergency Department (HOSPITAL_COMMUNITY): Payer: Self-pay

## 2016-08-17 ENCOUNTER — Emergency Department (HOSPITAL_COMMUNITY)
Admission: EM | Admit: 2016-08-17 | Discharge: 2016-08-17 | Disposition: A | Discharge status: Home / Self Care | Payer: Self-pay | Attending: Emergency Medicine | Admitting: Emergency Medicine

## 2016-08-17 DIAGNOSIS — F17210 Nicotine dependence, cigarettes, uncomplicated: Secondary | ICD-10-CM | POA: Insufficient documentation

## 2016-08-17 DIAGNOSIS — W1839XA Other fall on same level, initial encounter: Secondary | ICD-10-CM | POA: Insufficient documentation

## 2016-08-17 DIAGNOSIS — Y929 Unspecified place or not applicable: Secondary | ICD-10-CM | POA: Insufficient documentation

## 2016-08-17 DIAGNOSIS — Y999 Unspecified external cause status: Secondary | ICD-10-CM | POA: Insufficient documentation

## 2016-08-17 DIAGNOSIS — W19XXXA Unspecified fall, initial encounter: Secondary | ICD-10-CM

## 2016-08-17 DIAGNOSIS — Y939 Activity, unspecified: Secondary | ICD-10-CM | POA: Insufficient documentation

## 2016-08-17 DIAGNOSIS — M545 Low back pain: Secondary | ICD-10-CM | POA: Insufficient documentation

## 2016-08-17 DIAGNOSIS — Z79899 Other long term (current) drug therapy: Secondary | ICD-10-CM | POA: Insufficient documentation

## 2016-08-17 MED ORDER — PREDNISONE 10 MG (21) PO TBPK: 10.0000 mg | ORAL_TABLET | Freq: Every day | ORAL | 0 refills | Status: DC

## 2016-08-17 MED ORDER — KETOROLAC TROMETHAMINE 30 MG/ML IJ SOLN
30.0000 mg | Freq: Once | INTRAMUSCULAR | Status: AC
  Administered 2016-08-17: 30 mg via INTRAMUSCULAR
  Filled 2016-08-17: qty 1

## 2016-08-17 NOTE — ED Provider Notes (Signed)
[facility]-EMERGENCY DEPT Provider Note   CSN: [id number] Arrival date & time: 08/17/16  4098     History   Chief Complaint Chief Complaint  Patient presents with  . Back Pain    HPI Crystal Baker is a 50 y.o. female.  HPI 50 year old female who presents with low back pain. States she has a history of chronic back pain and multiple lumbar surgeries. States that on Tuesday she had a mechanical fall onto her tailbone. States that her dogs were running around her and she lost balance falling to the ground. Since then she has been having worsening low back pain with pain radiating around the right hip. No urinary retention, incontinence, saddle anesthesia or bowel incontinence. States that it is more difficult to walk after her fall, but no focal numbness or weakness. No fevers or chills. No other injuries. Been taking Aleve at home and using heat packs, without any improvement in her symptoms. Past Medical History:  Diagnosis Date  . Anxiety   . Anxiety and depression   . Arthritis   . Depression   . Syncope     Patient Active Problem List   Diagnosis Date Noted  . Chest pain 03/29/2016  . Syncope 03/29/2016  . Tobacco use disorder 03/29/2016  . Anxiety and depression 03/29/2016  . Interscapular pain 03/29/2016    Past Surgical History:  Procedure Laterality Date  . BACK SURGERY    . NECK SURGERY      OB History    No data available       Home Medications    Prior to Admission medications   Medication Sig Start Date End Date Taking? Authorizing Provider  Ibuprofen-Diphenhydramine HCl (ADVIL PM) 200-25 MG CAPS Take 2 tablets by mouth at bedtime as needed (pain/sleep).   Yes Historical Provider, MD  naproxen (NAPROSYN) 500 MG tablet Take 1 tablet (500 mg total) by mouth 2 (two) times daily. Patient not taking: Reported on 08/17/2016 03/23/16   Ace Gins Sam, PA-C  predniSONE (STERAPRED UNI-PAK 21 TAB) 10 MG (21) TBPK tablet Take 1 tablet (10 mg total) by mouth daily.  Take 6 tabs by mouth daily  for 2 days, then 5 tabs for 2 days, then 4 tabs for 2 days, then 3 tabs for 2 days, 2 tabs for 2 days, then 1 tab by mouth daily for 2 days 08/17/16   Lavera Guise, MD    Family History Family History  Problem Relation Age of Onset  . Cancer Other   . Heart disease Neg Hx     Social History Social History  Substance Use Topics  . Smoking status: Current Every Day Smoker    Packs/day: 1.00    Years: 35.00    Types: Cigarettes  . Smokeless tobacco: Never Used  . Alcohol use No     Allergies   Naproxen and Tramadol   Review of Systems Review of Systems 10/14 systems reviewed and are negative other than those stated in the HPI   Physical Exam Updated Vital Signs BP (!) 154/135 (BP Location: Right Arm)   Pulse 86   Temp 97.5 F (36.4 C) (Oral)   Resp 18   Ht 5\' 10"  (1.778 m)   Wt 180 lb (81.6 kg)   SpO2 99%   BMI 25.83 kg/m   Physical Exam Physical Exam  Nursing note and vitals reviewed. Constitutional: Well developed, well nourished, non-toxic, and in no acute distress Head: Normocephalic and atraumatic.  Mouth/Throat: Oropharynx is clear and moist.  Neck: Normal range of motion. Neck supple.  Cardiovascular: Normal rate and regular rhythm.   Pulmonary/Chest: Effort normal and breath sounds normal.  Abdominal: Soft. There is no tenderness. There is no rebound and no guarding.  Musculoskeletal: Tenderness to palpation of the lumbar spine without deformities or step-offs.  Neurological: Alert, no facial droop, fluent speech, moves all extremities symmetrically, sensation to light touch intact in bilateral lower extremities. +2 patellar and Achilles reflexes, full strength in hip flexion/extension, knee flexion/extension, and ankle dorsi and plantar flexion. Skin: Skin is warm and dry.  Psychiatric: Cooperative   ED Treatments / Results  Labs (all labs ordered are listed, but only abnormal results are displayed) Labs Reviewed - No  data to display  EKG  EKG Interpretation None       Radiology Dg Lumbar Spine Complete  Result Date: 08/17/2016 CLINICAL DATA:  Recent fall, low back and right hip pain EXAM: LUMBAR SPINE - COMPLETE 4+ VIEW COMPARISON:  None. FINDINGS: The lumbar vertebrae are in normal alignment. Intervertebral disc spaces appear normal. No compression deformity is seen. On oblique views there is some degenerative change involving the facet joints of L4-5 and L5-S1. The SI joints appear well corticated. IMPRESSION: Normal alignment with no acute abnormality. Mild degenerative change of the facet joints of L4-5 and L5-S1 Electronically Signed   By: Dwyane DeePaul  Barry M.D.   On: 08/17/2016 11:08   Dg Hip Unilat With Pelvis Min 4 Views Right  Result Date: 08/17/2016 CLINICAL DATA:  Recent fall, right hip pain EXAM: DG HIP (WITH OR WITHOUT PELVIS) 4+V RIGHT COMPARISON:  None. FINDINGS: The right hip joint appears normal. No acute fracture is seen. The right pelvic ramus is intact. The right SI joint is corticated. Artifacts overlie the medial right upper thigh. IMPRESSION: Negative. Electronically Signed   By: Dwyane DeePaul  Barry M.D.   On: 08/17/2016 11:09    Procedures Procedures (including critical care time)  Medications Ordered in ED Medications  ketorolac (TORADOL) 30 MG/ML injection 30 mg (30 mg Intramuscular Given 08/17/16 1029)     Initial Impression / Assessment and Plan / ED Course  I have reviewed the triage vital signs and the nursing notes.  Pertinent labs & imaging results that were available during my care of the patient were reviewed by me and considered in my medical decision making (see chart for details).  Clinical Course    50 year old female who presents with fall with low back pain. Is nontoxic in no acute distress. Elements of ? Radicular pain around the right hip. She is neuro intact. No concerning features of her exam or history currently to suggest spinal cord compression or acute spine  processes.  X-rays visualized and without traumatic injuries of the back or hip. She will continue anti-inflammatory medications, heat packs, and given steroid taper. Strict return and follow-up instructions reviewed. She expressed understanding of all discharge instructions and felt comfortable with the plan of care.   Final Clinical Impressions(s) / ED Diagnoses   Final diagnoses:  Fall  Acute left-sided low back pain, with sciatica presence unspecified    New Prescriptions New Prescriptions   PREDNISONE (STERAPRED UNI-PAK 21 TAB) 10 MG (21) TBPK TABLET    Take 1 tablet (10 mg total) by mouth daily. Take 6 tabs by mouth daily  for 2 days, then 5 tabs for 2 days, then 4 tabs for 2 days, then 3 tabs for 2 days, 2 tabs for 2 days, then 1 tab by mouth daily for 2 days  Lavera Guise, MD 08/17/16 1125

## 2016-08-17 NOTE — ED Triage Notes (Signed)
Patient reports falling 2 days ago onto her back/tail bone. Has hx of multiple back surgeries. Pain is in lower back/hip shoots down right leg.

## 2016-08-17 NOTE — Discharge Instructions (Signed)
Your x-rays does not show fracture.  Return without fail for worsening symptoms, including new numbness/weakness, loss of control of urine or bowel, inability to walk or any other symptoms concerning to you.  Take steroids as prescribed. Continue anti-inflammatory medications (Aleve or Advil) and heat packs

## 2016-09-06 ENCOUNTER — Emergency Department: Payer: Self-pay

## 2016-09-06 ENCOUNTER — Emergency Department
Admission: EM | Admit: 2016-09-06 | Discharge: 2016-09-06 | Disposition: A | Discharge status: Home / Self Care | Payer: Self-pay | Attending: Emergency Medicine | Admitting: Emergency Medicine

## 2016-09-06 ENCOUNTER — Encounter: Payer: Self-pay | Admitting: Emergency Medicine

## 2016-09-06 DIAGNOSIS — F17210 Nicotine dependence, cigarettes, uncomplicated: Secondary | ICD-10-CM | POA: Insufficient documentation

## 2016-09-06 DIAGNOSIS — R0602 Shortness of breath: Secondary | ICD-10-CM | POA: Insufficient documentation

## 2016-09-06 DIAGNOSIS — R079 Chest pain, unspecified: Secondary | ICD-10-CM

## 2016-09-06 DIAGNOSIS — R0789 Other chest pain: Secondary | ICD-10-CM | POA: Insufficient documentation

## 2016-09-06 LAB — BASIC METABOLIC PANEL
ANION GAP: 7 (ref 5–15)
BUN: 14 mg/dL (ref 6–20)
CALCIUM: 9.7 mg/dL (ref 8.9–10.3)
CO2: 26 mmol/L (ref 22–32)
CREATININE: 0.94 mg/dL (ref 0.44–1.00)
Chloride: 104 mmol/L (ref 101–111)
Glucose, Bld: 108 mg/dL — ABNORMAL HIGH (ref 65–99)
Potassium: 4.1 mmol/L (ref 3.5–5.1)
Sodium: 137 mmol/L (ref 135–145)

## 2016-09-06 LAB — CBC
HCT: 42.9 % (ref 35.0–47.0)
HEMOGLOBIN: 15 g/dL (ref 12.0–16.0)
MCH: 32.3 pg (ref 26.0–34.0)
MCHC: 34.9 g/dL (ref 32.0–36.0)
MCV: 92.5 fL (ref 80.0–100.0)
PLATELETS: 240 10*3/uL (ref 150–440)
RBC: 4.64 MIL/uL (ref 3.80–5.20)
RDW: 12.7 % (ref 11.5–14.5)
WBC: 8.3 10*3/uL (ref 3.6–11.0)

## 2016-09-06 LAB — FIBRIN DERIVATIVES D-DIMER (ARMC ONLY): FIBRIN DERIVATIVES D-DIMER (ARMC): 325 (ref 0–499)

## 2016-09-06 LAB — TROPONIN I

## 2016-09-06 MED ORDER — IPRATROPIUM-ALBUTEROL 0.5-2.5 (3) MG/3ML IN SOLN
3.0000 mL | Freq: Once | RESPIRATORY_TRACT | Status: AC
  Administered 2016-09-06: 3 mL via RESPIRATORY_TRACT
  Filled 2016-09-06: qty 3

## 2016-09-06 MED ORDER — MORPHINE SULFATE (PF) 4 MG/ML IV SOLN
4.0000 mg | Freq: Once | INTRAVENOUS | Status: AC
  Administered 2016-09-06: 4 mg via INTRAVENOUS

## 2016-09-06 MED ORDER — LORAZEPAM 2 MG/ML IJ SOLN
0.5000 mg | Freq: Once | INTRAMUSCULAR | Status: AC
  Administered 2016-09-06: 0.5 mg via INTRAVENOUS
  Filled 2016-09-06: qty 1

## 2016-09-06 MED ORDER — MORPHINE SULFATE (PF) 4 MG/ML IV SOLN
INTRAVENOUS | Status: AC
  Administered 2016-09-06: 4 mg via INTRAVENOUS
  Filled 2016-09-06: qty 1

## 2016-09-06 NOTE — Discharge Instructions (Signed)
You have been seen in the emergency department today for chest pain. Your workup has shown normal results. As we discussed please follow-up with your primary care physician in the next 1-2 days for recheck. Return to the emergency department for any further chest pain, trouble breathing, or any other symptom personally concerning to yourself. °

## 2016-09-06 NOTE — ED Provider Notes (Signed)
[facility] Emergency Department Provider Note  Time seen: 11:55 AM  I have reviewed the triage vital signs and the nursing notes.   HISTORY  Chief Complaint Chest Pain    HPI Crystal Baker is a 50 y.o. female With a past medical history of anxiety who presents the emergency department with chest  According to the patient since yesterday she has been experiencing central chest pain. Patient describes it as a sharp type pain. No worse with deep breathing. No lower leg pain or swelling. No recent cough or congestion. The patient does smoke daily for the past 35 years. Denies alcohol or drug use. Denies any personal or family cardiac history.Patient states mild shortness of breath but denies any nausea or diaphoresis. Patient is quite anxious appearing.  Past Medical History:  Diagnosis Date  . Anxiety   . Anxiety and depression   . Arthritis   . Depression   . Syncope     Patient Active Problem List   Diagnosis Date Noted  . Chest pain 03/29/2016  . Syncope 03/29/2016  . Tobacco use disorder 03/29/2016  . Anxiety and depression 03/29/2016  . Interscapular pain 03/29/2016    Past Surgical History:  Procedure Laterality Date  . BACK SURGERY    . NECK SURGERY      Prior to Admission medications   Medication Sig Start Date End Date Taking? Authorizing Provider  Ibuprofen-Diphenhydramine HCl (ADVIL PM) 200-25 MG CAPS Take 2 tablets by mouth at bedtime as needed (pain/sleep).    Historical Provider, MD  naproxen (NAPROSYN) 500 MG tablet Take 1 tablet (500 mg total) by mouth 2 (two) times daily. Patient not taking: Reported on 08/17/2016 03/23/16   Ace GinsSerena Y Sam, PA-C  predniSONE (STERAPRED UNI-PAK 21 TAB) 10 MG (21) TBPK tablet Take 1 tablet (10 mg total) by mouth daily. Take 6 tabs by mouth daily  for 2 days, then 5 tabs for 2 days, then 4 tabs for 2 days, then 3 tabs for 2 days, 2 tabs for 2 days, then 1 tab by mouth daily for 2 days 08/17/16   Lavera Guiseana Duo  Liu, MD    Allergies  Allergen Reactions  . Naproxen Itching and Rash    Palpitations in high doses  . Tramadol Itching and Rash    Palpitations     Family History  Problem Relation Age of Onset  . Cancer Other   . Heart disease Neg Hx     Social History Social History  Substance Use Topics  . Smoking status: Current Every Day Smoker    Packs/day: 1.00    Years: 35.00    Types: Cigarettes  . Smokeless tobacco: Never Used  . Alcohol use No    Review of Systems Constitutional: Negative for fever. Cardiovascular: positive for substernal chest pain. Mild. Respiratory: Mild shortness of Breath. Gastrointestinal: Negative for abdominal pain negative for vomiting. Genitourinary: Negative for dysuria. Musculoskeletal: Negative for back pain. Skin: Negative for rash. Neurological: Negative for headache 10-point ROS otherwise negative.  ____________________________________________   PHYSICAL EXAM:  VITAL SIGNS: ED Triage Vitals [09/06/16 1142]  Enc Vitals Group     BP 114/90     Pulse Rate 93     Resp 18     Temp 98.2 F (36.8 C)     Temp Source Oral     SpO2 100 %     Weight 165 lb (74.8 kg)     Height 5\' 10"  (1.778 m)     Head Circumference  Peak Flow      Pain Score 7     Pain Loc      Pain Edu?      Excl. in GC?     Constitutional: Alert and oriented. overall well appearing, but moderate anxiety. Eyes: Normal exam ENT   Head: Normocephalic and atraumatic   Mouth/Throat: Mucous membranes are moist. Cardiovascular: Normal rate, regular rhythm. No murmur Respiratory: Normal respiratory effort without tachypnea nor retractions. slight expiratory wheeze bilaterally. Gastrointestinal: Soft and nontender. No distention.   Musculoskeletal: Nontender with normal range of motion in all extremities. No lower extremity tenderness or edema. Neurologic:  Normal speech and language. No gross focal neurologic deficits Skin:  Skin is warm, dry and intact.   Psychiatric: Moderately anxious appearing  ____________________________________________    EKG  EKG reviewed and interpreted by myself shows normal sinus rhythm at 88 bpm, narrow QRS, normal axis, normal intervals, nonspecific ST changes. No ST elevations.  ____________________________________________    RADIOLOGY  Chest x-ray negative  ____________________________________________   INITIAL IMPRESSION / ASSESSMENT AND PLAN / ED COURSE  Pertinent labs & imaging results that were available during my care of the patient were reviewed by me and considered in my medical decision making (see chart for details).  Patient presents the emergency department with chest discomfort since yesterday. States the pain is fairly constant. States mild shortness of breath. Patient has a very normal physical examination besides she appears moderately anxious, and has a slight expiratory wheeze. We'll treat with a DuoNeb, check labs including cardiac enzymes, chest x-ray, and closely monitor in the emergency department. Patient is agreeable to this plan.  Chest x-ray is negative. Labs are normal. Troponin is negative. Patient now states it hurts when she takes a deep breath in. Given the pleuritic nature we will check a d-dimer. The d-dimer is negative we'll have the patient follow up with cardiology, if positive we'll proceed with a CT scan to further evaluate. Overall the patient appears well, 100% room air saturation with a pulse around 90 bpm.  D-dimer is negative. Patient will be discharged home with PCP and cardiology follow-up.  ____________________________________________   FINAL CLINICAL IMPRESSION(S) / ED DIAGNOSES  Chest pain    Minna AntisKevin Adara Kittle, MD 09/06/16 1423

## 2016-09-06 NOTE — ED Notes (Signed)
Pt sitting up in bed, eating food brought by husband. Denies nausea at this time. Reports continued relief from pain.

## 2016-09-06 NOTE — ED Notes (Signed)
Pt ambulatory to lobby with family. NAD noted.

## 2016-09-06 NOTE — ED Triage Notes (Signed)
Pt with chest pain started yesterday, states is constant  With some shortness of breath.

## 2016-12-18 ENCOUNTER — Encounter: Payer: Self-pay | Admitting: Physical Medicine & Rehabilitation

## 2017-01-10 ENCOUNTER — Encounter: Payer: Self-pay | Admitting: Physical Medicine & Rehabilitation

## 2017-01-12 ENCOUNTER — Inpatient Hospital Stay (HOSPITAL_COMMUNITY)
Admission: AD | Admit: 2017-01-12 | Discharge: 2017-01-12 | Disposition: A | Discharge status: Home / Self Care | Payer: Self-pay | Source: Ambulatory Visit | Attending: Family Medicine | Admitting: Family Medicine

## 2017-01-12 ENCOUNTER — Inpatient Hospital Stay (HOSPITAL_COMMUNITY): Payer: Self-pay

## 2017-01-12 ENCOUNTER — Encounter (HOSPITAL_COMMUNITY): Payer: Self-pay | Admitting: *Deleted

## 2017-01-12 DIAGNOSIS — F329 Major depressive disorder, single episode, unspecified: Secondary | ICD-10-CM | POA: Insufficient documentation

## 2017-01-12 DIAGNOSIS — M1990 Unspecified osteoarthritis, unspecified site: Secondary | ICD-10-CM | POA: Insufficient documentation

## 2017-01-12 DIAGNOSIS — F17210 Nicotine dependence, cigarettes, uncomplicated: Secondary | ICD-10-CM | POA: Insufficient documentation

## 2017-01-12 DIAGNOSIS — G8911 Acute pain due to trauma: Secondary | ICD-10-CM

## 2017-01-12 DIAGNOSIS — R222 Localized swelling, mass and lump, trunk: Secondary | ICD-10-CM | POA: Insufficient documentation

## 2017-01-12 DIAGNOSIS — M25551 Pain in right hip: Secondary | ICD-10-CM

## 2017-01-12 DIAGNOSIS — W010XXA Fall on same level from slipping, tripping and stumbling without subsequent striking against object, initial encounter: Secondary | ICD-10-CM

## 2017-01-12 DIAGNOSIS — W19XXXA Unspecified fall, initial encounter: Secondary | ICD-10-CM | POA: Insufficient documentation

## 2017-01-12 DIAGNOSIS — M545 Low back pain, unspecified: Secondary | ICD-10-CM

## 2017-01-12 DIAGNOSIS — F419 Anxiety disorder, unspecified: Secondary | ICD-10-CM | POA: Insufficient documentation

## 2017-01-12 DIAGNOSIS — G8929 Other chronic pain: Secondary | ICD-10-CM

## 2017-01-12 DIAGNOSIS — R55 Syncope and collapse: Secondary | ICD-10-CM | POA: Insufficient documentation

## 2017-01-12 MED ORDER — KETOROLAC TROMETHAMINE 60 MG/2ML IM SOLN
30.0000 mg | Freq: Once | INTRAMUSCULAR | Status: DC
  Filled 2017-01-12: qty 2

## 2017-01-12 MED ORDER — OXYCODONE-ACETAMINOPHEN 5-325 MG PO TABS: 1.0000 | ORAL_TABLET | Freq: Once | ORAL | Status: DC

## 2017-01-12 MED ORDER — KETOROLAC TROMETHAMINE 60 MG/2ML IM SOLN: 60.0000 mg | Freq: Once | INTRAMUSCULAR | Status: DC

## 2017-01-12 MED ORDER — CYCLOBENZAPRINE HCL 5 MG PO TABS
7.5000 mg | ORAL_TABLET | Freq: Once | ORAL | Status: AC
  Administered 2017-01-12: 7.5 mg via ORAL
  Filled 2017-01-12: qty 2

## 2017-01-12 MED ORDER — CYCLOBENZAPRINE HCL 5 MG PO TABS: ORAL_TABLET | ORAL | 0 refills | Status: DC

## 2017-01-12 MED ORDER — OXYCODONE-ACETAMINOPHEN 5-325 MG PO TABS: 1.0000 | ORAL_TABLET | ORAL | 0 refills | Status: DC | PRN

## 2017-01-12 NOTE — Discharge Instructions (Signed)
Back Exercises The following exercises strengthen the muscles that help to support the back. They also help to keep the lower back flexible. Doing these exercises can help to prevent back pain or lessen existing pain. If you have back pain or discomfort, try doing these exercises 2-3 times each day or as told by your health care provider. When the pain goes away, do them once each day, but increase the number of times that you repeat the steps for each exercise (do more repetitions). If you do not have back pain or discomfort, do these exercises once each day or as told by your health care provider. Exercises Single Knee to Chest   Repeat these steps 3-5 times for each leg: 1. Lie on your back on a firm bed or the floor with your legs extended. 2. Bring one knee to your chest. Your other leg should stay extended and in contact with the floor. 3. Hold your knee in place by grabbing your knee or thigh. 4. Pull on your knee until you feel a gentle stretch in your lower back. 5. Hold the stretch for 10-30 seconds. 6. Slowly release and straighten your leg. Pelvic Tilt   Repeat these steps 5-10 times: 1. Lie on your back on a firm bed or the floor with your legs extended. 2. Bend your knees so they are pointing toward the ceiling and your feet are flat on the floor. 3. Tighten your lower abdominal muscles to press your lower back against the floor. This motion will tilt your pelvis so your tailbone points up toward the ceiling instead of pointing to your feet or the floor. 4. With gentle tension and even breathing, hold this position for 5-10 seconds. Cat-Cow   Repeat these steps until your lower back becomes more flexible: 1. Get into a hands-and-knees position on a firm surface. Keep your hands under your shoulders, and keep your knees under your hips. You may place padding under your knees for comfort. 2. Let your head hang down, and point your tailbone toward the floor so your lower back  becomes rounded like the back of a cat. 3. Hold this position for 5 seconds. 4. Slowly lift your head and point your tailbone up toward the ceiling so your back forms a sagging arch like the back of a cow. 5. Hold this position for 5 seconds. Press-Ups   Repeat these steps 5-10 times: 1. Lie on your abdomen (face-down) on the floor. 2. Place your palms near your head, about shoulder-width apart. 3. While you keep your back as relaxed as possible and keep your hips on the floor, slowly straighten your arms to raise the top half of your body and lift your shoulders. Do not use your back muscles to raise your upper torso. You may adjust the placement of your hands to make yourself more comfortable. 4. Hold this position for 5 seconds while you keep your back relaxed. 5. Slowly return to lying flat on the floor. Bridges   Repeat these steps 10 times: 1. Lie on your back on a firm surface. 2. Bend your knees so they are pointing toward the ceiling and your feet are flat on the floor. 3. Tighten your buttocks muscles and lift your buttocks off of the floor until your waist is at almost the same height as your knees. You should feel the muscles working in your buttocks and the back of your thighs. If you do not feel these muscles, slide your feet 1-2 inches farther   away from your buttocks. 4. Hold this position for 3-5 seconds. 5. Slowly lower your hips to the starting position, and allow your buttocks muscles to relax completely. If this exercise is too easy, try doing it with your arms crossed over your chest. Abdominal Crunches   Repeat these steps 5-10 times: 1. Lie on your back on a firm bed or the floor with your legs extended. 2. Bend your knees so they are pointing toward the ceiling and your feet are flat on the floor. 3. Cross your arms over your chest. 4. Tip your chin slightly toward your chest without bending your neck. 5. Tighten your abdominal muscles and slowly raise your trunk  (torso) high enough to lift your shoulder blades a tiny bit off of the floor. Avoid raising your torso higher than that, because it can put too much stress on your low back and it does not help to strengthen your abdominal muscles. 6. Slowly return to your starting position. Back Lifts  Repeat these steps 5-10 times: 1. Lie on your abdomen (face-down) with your arms at your sides, and rest your forehead on the floor. 2. Tighten the muscles in your legs and your buttocks. 3. Slowly lift your chest off of the floor while you keep your hips pressed to the floor. Keep the back of your head in line with the curve in your back. Your eyes should be looking at the floor. 4. Hold this position for 3-5 seconds. 5. Slowly return to your starting position. Contact a health care provider if:  Your back pain or discomfort gets much worse when you do an exercise.  Your back pain or discomfort does not lessen within 2 hours after you exercise. If you have any of these problems, stop doing these exercises right away. Do not do them again unless your health care provider says that you can. Get help right away if:  You develop sudden, severe back pain. If this happens, stop doing the exercises right away. Do not do them again unless your health care provider says that you can. This information is not intended to replace advice given to you by your health care provider. Make sure you discuss any questions you have with your health care provider. Document Released: 11/23/2004 Document Revised: 02/23/2016 Document Reviewed: 12/10/2014 Elsevier Interactive Patient Education  2017 [employer]  Back Pain, Adult Back pain is very common. The pain often gets better over time. The cause of back pain is usually not dangerous. Most people can learn to manage their back pain on their own. Follow these instructions at home: Watch your back pain for any changes. The following actions may help to lessen any pain you are  feeling:  Stay active. Start with short walks on flat ground if you can. Try to walk farther each day.  Exercise regularly as told by your doctor. Exercise helps your back heal faster. It also helps avoid future injury by keeping your muscles strong and flexible.  Do not sit, drive, or stand in one place for more than 30 minutes.  Do not stay in bed. Resting more than 1-2 days can slow down your recovery.  Be careful when you bend or lift an object. Use good form when lifting:  Bend at your knees.  Keep the object close to your body.  Do not twist.  Sleep on a firm mattress. Lie on your side, and bend your knees. If you lie on your back, put a pillow under your knees.    Take medicines only as told by your doctor.  Put ice on the injured area.  Put ice in a plastic bag.  Place a towel between your skin and the bag.  Leave the ice on for 20 minutes, 2-3 times a day for the first 2-3 days. After that, you can switch between ice and heat packs.  Avoid feeling anxious or stressed. Find good ways to deal with stress, such as exercise.  Maintain a healthy weight. Extra weight puts stress on your back. Contact a doctor if:  You have pain that does not go away with rest or medicine.  You have worsening pain that goes down into your legs or buttocks.  You have pain that does not get better in one week.  You have pain at night.  You lose weight.  You have a fever or chills. Get help right away if:  You cannot control when you poop (bowel movement) or pee (urinate).  Your arms or legs feel weak.  Your arms or legs lose feeling (numbness).  You feel sick to your stomach (nauseous) or throw up (vomit).  You have belly (abdominal) pain.  You feel like you may pass out (faint). This information is not intended to replace advice given to you by your health care provider. Make sure you discuss any questions you have with your health care provider. Document Released:  04/03/2008 Document Revised: 03/23/2016 Document Reviewed: 02/17/2014 Elsevier Interactive Patient Education  2017 Elsevier Inc.  

## 2017-01-12 NOTE — MAU Note (Signed)
Patient states she fell this am; was outside with dog and the dog ran--when it did the leash caught her and she was knocked off her feet. Also states she has a lump on her lower right back that has grown in size. Endorses having multiple back and neck surgeries.

## 2017-01-12 NOTE — MAU Provider Note (Signed)
History of Present Illness   Patient Identification Crystal Baker is a 51 y.o. female.  Patient information was obtained from patient. History/Exam limitations: none. Patient presented to the Emergency Department by private vehicle, husband drove.  Chief Complaint  Fall and lump on back  Patient presents with complaint of back pain. This is a result of a fall, but has known chronic back pain with multiple lumbar back surgeries. Onset of pain was 7 AM and has been unchanged since. The pain is located in right lower back, sacroiliac region, right hip, described as sharp, stabbing and throbbing and rated as severe, without radiation. Symptoms include hip/back pain. The patient also complains of chronic back pain and lump present for >1 year. The patient denies numbness, incontinence, abdominal pain, fever, chest pain. The patient denies other injuries, states did not fall on arm/hands or hit head. Care prior to arrival consisted of nothing aside from heating pad.  Reviewing patient's chart she had presented to the ER with the exact same complaint in October, with the same story of her dog tripping her and falling and having right hip and right back pain. This is the second incident of this mechanism fall. Patient states she cannot give insurance until January. That she has appointment with the pain medicine clinic that does not start until May. She says at home for her chronic pain she doesn't take anything. She is not currently following up with any physicians, due to lack of insurance. States she just "deals with the pain," and feels normal he wants to touch her. She is a chronic smoker.  Past Medical History:  Diagnosis Date  . Anxiety    in the past  . Anxiety and depression   . Arthritis   . Depression    in the past  . Syncope    Family History  Problem Relation Age of Onset  . Cancer Other   . Heart disease Neg Hx    No current facility-administered medications for this  encounter.    Allergies  Allergen Reactions  . Naproxen Itching and Rash    Palpitations in high doses  . Tramadol Itching and Rash    Palpitations    Social History   Social History  . Marital status: Single    Spouse name: N/A  . Number of children: 2  . Years of education: N/A   Occupational History  . unemplyed    Social History Main Topics  . Smoking status: Current Every Day Smoker    Packs/day: 1.00    Years: 35.00    Types: Cigarettes  . Smokeless tobacco: Never Used  . Alcohol use No  . Drug use: Yes    Types: Marijuana  . Sexual activity: Not on file   Other Topics Concern  . Not on file   Social History Narrative  . No narrative on file   Review of Systems Pertinent items are noted in HPI.   Physical Exam   BP 112/84 (BP Location: Right Arm)   Pulse 88   Temp 98.1 F (36.7 C) (Oral)   Resp 18   Ht 5\' 10"  (1.778 m)   Wt 187 lb 1.9 oz (84.9 kg)   SpO2 99%   BMI 26.85 kg/m  BP 112/84 (BP Location: Right Arm)   Pulse 88   Temp 98.1 F (36.7 C) (Oral)   Resp 18   Ht 5\' 10"  (1.778 m)   Wt 187 lb 1.9 oz (84.9 kg)   SpO2 99%  BMI 26.85 kg/m   General Appearance:    Alert, cooperative, mild distress/upset, tearing, appears older than stated age  Head:    Normocephalic, without obvious abnormality, atraumatic  Eyes:    PERRL, conjunctiva/corneas clear, EOM's intact   Neck:   Supple, symmetrical, trachea midline, no adenopathy;    thyroid:  no enlargement/tenderness/nodules; no carotid   bruit or JVD  Back:     Symmetric, no curvature, ROM normal, no CVA tenderness; Cervical, thoracic, lumbar spine without spinous process tenderness or stepoffs. +Paraspinal hypertrophy and spasm of R>L side.  Lungs:     Clear to auscultation bilaterally, respirations unlabored  Chest Wall:    No tenderness or deformity   Heart:    Regular rate and rhythm, S1 and S2 normal, no murmur, rub   or gallop  Abdomen:     Soft, non-tender, bowel sounds active all four  quadrants,    no masses, no organomegaly  Extremities:   Extremities normal, atraumatic, no cyanosis or edema; Good ROM with LLE; guarded when going through ROM with right hip. Able to stand on bilaterally extremities but purposeful guarding when on right leg.   Pulses:   2+ and symmetric all extremities  Skin:   Skin color, texture, turgor normal, no rashes or lesions  Neurologic:   CNII-XII intact, normal strength, sensation and reflexes    throughout    MAU Course   Studies: Imaging: X-ray: Extremities: Hip Normal Pelvis: Normal  Records Reviewed: Old medical records. Previous radiology studies.  Treatments: Flexeril  MDM: Plan of care reviewed with patient, including labs and tests ordered and medical treatment. Patient was able to walk out of MAU on her own, without assistance. Patient was happy with plan of care including following with her own primary care provider to discuss further medications to help with her chronic pain and to follow up with pain management in May. Discussed importance of also quitting smoking to help with pain control. Reviewed patient's narcotic prescriptions, was noted to only have prescription filled in November for a short course from an ER, does not get on a regular basis here in [place].  Disposition: Home Narcotic pain medication, Muscle relaxants and Advised to return for signs of head injury, weakness, numbness or tingling to extremities, incontinence. Discussed with patient at length regarding outpatient treatment plans including following up with primary care provider for her chronic pain. Discuss when to return to the ED. Allowed patient to vent regarding her previous care, patient ended up feeling better about future plan of care.  Cleda Clarks, DO  OB Fellow 01/12/2017  10:33 AM

## 2017-02-14 ENCOUNTER — Telehealth: Payer: Self-pay | Admitting: Physical Medicine & Rehabilitation

## 2017-02-14 ENCOUNTER — Encounter: Payer: Self-pay | Attending: Physical Medicine & Rehabilitation | Admitting: Physical Medicine & Rehabilitation

## 2017-02-14 ENCOUNTER — Encounter: Payer: Self-pay | Admitting: Physical Medicine & Rehabilitation

## 2017-02-14 VITALS — BP 118/82 | HR 80 | Resp 14

## 2017-02-14 DIAGNOSIS — Z720 Tobacco use: Secondary | ICD-10-CM

## 2017-02-14 DIAGNOSIS — M25551 Pain in right hip: Secondary | ICD-10-CM | POA: Insufficient documentation

## 2017-02-14 DIAGNOSIS — M545 Low back pain: Secondary | ICD-10-CM | POA: Insufficient documentation

## 2017-02-14 DIAGNOSIS — M62838 Other muscle spasm: Secondary | ICD-10-CM | POA: Insufficient documentation

## 2017-02-14 DIAGNOSIS — R269 Unspecified abnormalities of gait and mobility: Secondary | ICD-10-CM | POA: Insufficient documentation

## 2017-02-14 DIAGNOSIS — G8929 Other chronic pain: Secondary | ICD-10-CM | POA: Insufficient documentation

## 2017-02-14 DIAGNOSIS — G479 Sleep disorder, unspecified: Secondary | ICD-10-CM | POA: Insufficient documentation

## 2017-02-14 DIAGNOSIS — F17210 Nicotine dependence, cigarettes, uncomplicated: Secondary | ICD-10-CM | POA: Insufficient documentation

## 2017-02-14 MED ORDER — METHOCARBAMOL 500 MG PO TABS: 500.0000 mg | ORAL_TABLET | Freq: Two times a day (BID) | ORAL | 1 refills | Status: DC | PRN

## 2017-02-14 MED ORDER — PREGABALIN 100 MG PO CAPS: 100.0000 mg | ORAL_CAPSULE | Freq: Three times a day (TID) | ORAL | 0 refills | Status: DC

## 2017-02-14 MED ORDER — AMITRIPTYLINE HCL 10 MG PO TABS: 10.0000 mg | ORAL_TABLET | Freq: Every day | ORAL | 1 refills | Status: DC

## 2017-02-14 MED ORDER — MELOXICAM 15 MG PO TABS: 15.0000 mg | ORAL_TABLET | Freq: Every day | ORAL | 1 refills | Status: DC

## 2017-02-14 MED ORDER — DULOXETINE HCL 30 MG PO CPEP: 30.0000 mg | ORAL_CAPSULE | Freq: Every day | ORAL | 1 refills | Status: DC

## 2017-02-14 NOTE — Telephone Encounter (Signed)
I did not prescribe her Flexeril.  If it is not working for her, she can stop taking it.  I have prescribed her other medications.  Thanks

## 2017-02-14 NOTE — Telephone Encounter (Signed)
Patient was in office today and given prescriptions for amitriptyline and lyrica, she can't afford these medications.  Please call patient.
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
## 2017-02-14 NOTE — Telephone Encounter (Signed)
Pt stated she already has Flexeril and it does not work. She wants to know if there is anything else she can have instead of the currently prescribed medication.

## 2017-02-16 NOTE — Telephone Encounter (Signed)
She associates the medications with depression and I explained that these medications have research backed evidence that they treat nerve pain and they are standards of care with chronic pain.  She is still not wanting them and says she cannot afford.  She brought up past treatment (from [place]). I told her that we do not have her records and should have them and she needs to request them be sent to [place]. She will do this.

## 2017-02-17 NOTE — Telephone Encounter (Signed)
Thank you.  Additionally, I spent a significant time explaining to her that I did not have any free treatment for pain control and that medications would cost money.  She states "I will just have to spend the money".  Thanks.

## 2017-03-10 ENCOUNTER — Ambulatory Visit (HOSPITAL_COMMUNITY)
Admission: RE | Admit: 2017-03-10 | Discharge: 2017-03-10 | Disposition: A | Discharge status: Home / Self Care | Payer: Self-pay | Source: Ambulatory Visit | Attending: Physical Medicine & Rehabilitation | Admitting: Physical Medicine & Rehabilitation

## 2017-03-10 DIAGNOSIS — M5126 Other intervertebral disc displacement, lumbar region: Secondary | ICD-10-CM | POA: Insufficient documentation

## 2017-03-10 DIAGNOSIS — G8929 Other chronic pain: Secondary | ICD-10-CM | POA: Insufficient documentation

## 2017-03-10 DIAGNOSIS — M5127 Other intervertebral disc displacement, lumbosacral region: Secondary | ICD-10-CM | POA: Insufficient documentation

## 2017-03-10 DIAGNOSIS — M48061 Spinal stenosis, lumbar region without neurogenic claudication: Secondary | ICD-10-CM | POA: Insufficient documentation

## 2017-03-10 DIAGNOSIS — M5136 Other intervertebral disc degeneration, lumbar region: Secondary | ICD-10-CM | POA: Insufficient documentation

## 2017-03-10 DIAGNOSIS — M545 Low back pain, unspecified: Secondary | ICD-10-CM

## 2017-03-10 MED ORDER — GADOBENATE DIMEGLUMINE 529 MG/ML IV SOLN
15.0000 mL | Freq: Once | INTRAVENOUS | Status: AC | PRN
  Administered 2017-03-10: 15 mL via INTRAVENOUS

## 2017-03-14 ENCOUNTER — Encounter: Payer: Self-pay | Admitting: Physical Medicine & Rehabilitation

## 2017-03-14 ENCOUNTER — Ambulatory Visit: Payer: Self-pay | Admitting: Physical Medicine & Rehabilitation

## 2017-03-14 ENCOUNTER — Encounter: Payer: Self-pay | Attending: Physical Medicine & Rehabilitation | Admitting: Physical Medicine & Rehabilitation

## 2017-03-14 VITALS — BP 153/84 | HR 86

## 2017-03-14 DIAGNOSIS — M62838 Other muscle spasm: Secondary | ICD-10-CM | POA: Insufficient documentation

## 2017-03-14 DIAGNOSIS — Z720 Tobacco use: Secondary | ICD-10-CM

## 2017-03-14 DIAGNOSIS — G8929 Other chronic pain: Secondary | ICD-10-CM | POA: Insufficient documentation

## 2017-03-14 DIAGNOSIS — R269 Unspecified abnormalities of gait and mobility: Secondary | ICD-10-CM | POA: Insufficient documentation

## 2017-03-14 DIAGNOSIS — M25551 Pain in right hip: Secondary | ICD-10-CM | POA: Insufficient documentation

## 2017-03-14 DIAGNOSIS — F17210 Nicotine dependence, cigarettes, uncomplicated: Secondary | ICD-10-CM | POA: Insufficient documentation

## 2017-03-14 DIAGNOSIS — G479 Sleep disorder, unspecified: Secondary | ICD-10-CM | POA: Insufficient documentation

## 2017-03-14 DIAGNOSIS — M545 Low back pain: Secondary | ICD-10-CM | POA: Insufficient documentation

## 2017-03-14 MED ORDER — DICLOFENAC SODIUM 50 MG PO TBEC: 50.0000 mg | DELAYED_RELEASE_TABLET | Freq: Three times a day (TID) | ORAL | 1 refills | Status: AC
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
## 2017-03-18 ENCOUNTER — Encounter (HOSPITAL_COMMUNITY): Payer: Self-pay

## 2017-03-18 ENCOUNTER — Emergency Department (HOSPITAL_COMMUNITY)
Admission: EM | Admit: 2017-03-18 | Discharge: 2017-03-18 | Disposition: A | Discharge status: Home / Self Care | Payer: Self-pay | Attending: Emergency Medicine | Admitting: Emergency Medicine

## 2017-03-18 DIAGNOSIS — Y999 Unspecified external cause status: Secondary | ICD-10-CM | POA: Insufficient documentation

## 2017-03-18 DIAGNOSIS — Y9289 Other specified places as the place of occurrence of the external cause: Secondary | ICD-10-CM | POA: Insufficient documentation

## 2017-03-18 DIAGNOSIS — S39012A Strain of muscle, fascia and tendon of lower back, initial encounter: Secondary | ICD-10-CM | POA: Insufficient documentation

## 2017-03-18 DIAGNOSIS — F17210 Nicotine dependence, cigarettes, uncomplicated: Secondary | ICD-10-CM | POA: Insufficient documentation

## 2017-03-18 DIAGNOSIS — X509XXA Other and unspecified overexertion or strenuous movements or postures, initial encounter: Secondary | ICD-10-CM | POA: Insufficient documentation

## 2017-03-18 DIAGNOSIS — Y9389 Activity, other specified: Secondary | ICD-10-CM | POA: Insufficient documentation

## 2017-03-18 LAB — URINALYSIS, ROUTINE W REFLEX MICROSCOPIC
BILIRUBIN URINE: NEGATIVE
Glucose, UA: NEGATIVE mg/dL
Hgb urine dipstick: NEGATIVE
KETONES UR: NEGATIVE mg/dL
LEUKOCYTES UA: NEGATIVE
NITRITE: NEGATIVE
PH: 6 (ref 5.0–8.0)
Protein, ur: NEGATIVE mg/dL
SPECIFIC GRAVITY, URINE: 1.003 — AB (ref 1.005–1.030)

## 2017-03-18 LAB — CBC
HEMATOCRIT: 38.4 % (ref 36.0–46.0)
HEMOGLOBIN: 12.7 g/dL (ref 12.0–15.0)
MCH: 31.2 pg (ref 26.0–34.0)
MCHC: 33.1 g/dL (ref 30.0–36.0)
MCV: 94.3 fL (ref 78.0–100.0)
Platelets: 214 10*3/uL (ref 150–400)
RBC: 4.07 MIL/uL (ref 3.87–5.11)
RDW: 12.8 % (ref 11.5–15.5)
WBC: 6.5 10*3/uL (ref 4.0–10.5)

## 2017-03-18 LAB — COMPREHENSIVE METABOLIC PANEL
ALBUMIN: 3.7 g/dL (ref 3.5–5.0)
ALT: 27 U/L (ref 14–54)
ANION GAP: 7 (ref 5–15)
AST: 26 U/L (ref 15–41)
Alkaline Phosphatase: 77 U/L (ref 38–126)
BILIRUBIN TOTAL: 0.5 mg/dL (ref 0.3–1.2)
BUN: 9 mg/dL (ref 6–20)
CHLORIDE: 105 mmol/L (ref 101–111)
CO2: 27 mmol/L (ref 22–32)
Calcium: 9.2 mg/dL (ref 8.9–10.3)
Creatinine, Ser: 0.93 mg/dL (ref 0.44–1.00)
GFR calc Af Amer: >60 mL/min (ref >60)
GLUCOSE: 106 mg/dL — AB (ref 65–99)
POTASSIUM: 3.9 mmol/L (ref 3.5–5.1)
Sodium: 139 mmol/L (ref 135–145)
TOTAL PROTEIN: 6.7 g/dL (ref 6.5–8.1)

## 2017-03-18 LAB — POC URINE PREG, ED: Preg Test, Ur: NEGATIVE

## 2017-03-18 LAB — PREGNANCY, URINE: Preg Test, Ur: NEGATIVE

## 2017-03-18 LAB — LIPASE, BLOOD: LIPASE: 29 U/L (ref 11–51)

## 2017-03-18 MED ORDER — METHOCARBAMOL 500 MG PO TABS
750.0000 mg | ORAL_TABLET | Freq: Once | ORAL | Status: AC
  Administered 2017-03-18: 750 mg via ORAL
  Filled 2017-03-18: qty 2

## 2017-03-18 MED ORDER — KETOROLAC TROMETHAMINE 15 MG/ML IJ SOLN
15.0000 mg | Freq: Once | INTRAMUSCULAR | Status: AC
  Administered 2017-03-18: 15 mg via INTRAVENOUS
  Filled 2017-03-18: qty 1

## 2017-03-18 NOTE — ED Triage Notes (Signed)
Pt complaining of R flank pain x 5 days. Pt states hx of kidney stones and multiple UTI's. Pt denies any fevers. Pt states frequent urination.

## 2017-03-18 NOTE — ED Notes (Signed)
Pt unable to get comfortable, pain has gotten worse over 5 days-- unable to sit still. Has had urinary frequency.

## 2017-03-18 NOTE — ED Provider Notes (Signed)
[facility]-EMERGENCY DEPT Provider Note   CSN: [id number] Arrival date & time: 03/18/17  1506     History   Chief Complaint Chief Complaint  Patient presents with  . Flank Pain    HPI Crystal Baker is a 51 y.o. female.  The history is provided by the patient.  Flank Pain  This is a recurrent problem. The current episode started 2 days ago. The problem occurs constantly. The problem has been gradually worsening. Pertinent negatives include no chest pain, no abdominal pain, no headaches and no shortness of breath. The symptoms are aggravated by twisting and bending. Nothing relieves the symptoms. She has tried nothing for the symptoms. The treatment provided no relief.   Patient and her husband reported that she just got back into the gym earlier this week and did an extremity was working out.   H/o UTI, Pyelonephritis; may be similar to those episodes.  Also has h/o DDD in lumbar region s/p laminectomy. Past Medical History:  Diagnosis Date  . Anxiety    in the past  . Anxiety and depression   . Arthritis   . Depression    in the past  . Syncope     Patient Active Problem List   Diagnosis Date Noted  . Chest pain 03/29/2016  . Syncope 03/29/2016  . Tobacco use disorder 03/29/2016  . Anxiety and depression 03/29/2016  . Interscapular pain 03/29/2016    Past Surgical History:  Procedure Laterality Date  . BACK SURGERY    . NECK SURGERY      OB History    Gravida Para Term Preterm AB Living   2         2   SAB TAB Ectopic Multiple Live Births           2       Home Medications    Prior to Admission medications   Medication Sig Start Date End Date Taking? Authorizing Provider  amitriptyline (ELAVIL) 10 MG tablet Take 1 tablet (10 mg total) by mouth at bedtime. Patient not taking: Reported on 03/14/2017 02/14/17   Marcello Fennel, MD  cyclobenzaprine (FLEXERIL) 5 MG tablet Take 1-2 tablets three times daily as needed for back pain or muscle spasms. Medication  will likely make you drowsy, do not operate heavy machinery or drive. Patient not taking: Reported on 02/14/2017 01/12/17   Mumaw, Hiram Comber, DO  diclofenac (VOLTAREN) 50 MG EC tablet Take 1 tablet (50 mg total) by mouth 3 (three) times daily. 03/14/17 04/13/17  Marcello Fennel, MD  DULoxetine (CYMBALTA) 30 MG capsule Take 1 capsule (30 mg total) by mouth daily. 02/14/17 03/16/17  Marcello Fennel, MD  methocarbamol (ROBAXIN) 500 MG tablet Take 1 tablet (500 mg total) by mouth 2 (two) times daily as needed for muscle spasms. 02/14/17   Marcello Fennel, MD  oxyCODONE-acetaminophen (ROXICET) 5-325 MG tablet Take 1 tablet by mouth every 4 (four) hours as needed for severe pain. Patient not taking: Reported on 02/14/2017 01/12/17   Mumaw, Hiram Comber, DO  pregabalin (LYRICA) 100 MG capsule Take 1 capsule (100 mg total) by mouth 3 (three) times daily. Patient not taking: Reported on 03/14/2017 02/14/17 03/16/17  Marcello Fennel, MD    Family History Family History  Problem Relation Age of Onset  . Cancer Other   . Heart disease Neg Hx     Social History Social History  Substance Use Topics  . Smoking status: Current Every Day Smoker    Packs/day:  1.00    Years: 35.00    Types: Cigarettes  . Smokeless tobacco: Never Used  . Alcohol use No     Allergies   Naproxen and Tramadol   Review of Systems Review of Systems  Constitutional: Negative for chills and fever.  Respiratory: Negative for cough and shortness of breath.   Cardiovascular: Negative for chest pain.  Gastrointestinal: Positive for diarrhea and nausea. Negative for abdominal distention, abdominal pain and vomiting.  Genitourinary: Positive for flank pain and frequency. Negative for dysuria, pelvic pain, vaginal bleeding, vaginal discharge and vaginal pain.       Post menopausal  Neurological: Negative for headaches.  All other systems are reviewed and are negative for acute change except as noted in the  HPI   Physical Exam Updated Vital Signs BP (!) 130/93   Pulse 72   Temp 98.5 F (36.9 C) (Oral)   Resp 20   SpO2 100%   Physical Exam  Constitutional: She is oriented to person, place, and time. She appears well-developed and well-nourished. No distress.  HENT:  Head: Normocephalic and atraumatic.  Nose: Nose normal.  Eyes: Conjunctivae and EOM are normal. Pupils are equal, round, and reactive to light. Right eye exhibits no discharge. Left eye exhibits no discharge. No scleral icterus.  Neck: Normal range of motion. Neck supple.  Cardiovascular: Normal rate and regular rhythm.  Exam reveals no gallop and no friction rub.   No murmur heard. Pulmonary/Chest: Effort normal and breath sounds normal. No stridor. No respiratory distress. She has no rales.  Abdominal: Soft. She exhibits no distension. There is no tenderness. There is CVA tenderness (right). There is no rigidity, no rebound, no guarding, no tenderness at McBurney's point and negative Murphy's sign.  Musculoskeletal: She exhibits no edema.       Lumbar back: She exhibits tenderness.       Back:  Neurological: She is alert and oriented to person, place, and time.  Skin: Skin is warm and dry. No rash noted. She is not diaphoretic. No erythema.  Psychiatric: She has a normal mood and affect.  Vitals reviewed.    ED Treatments / Results  Labs (all labs ordered are listed, but only abnormal results are displayed) Labs Reviewed  COMPREHENSIVE METABOLIC PANEL - Abnormal; Notable for the following:       Result Value   Glucose, Bld 106 (*)    All other components within normal limits  URINALYSIS, ROUTINE W REFLEX MICROSCOPIC - Abnormal; Notable for the following:    Color, Urine STRAW (*)    Specific Gravity, Urine 1.003 (*)    All other components within normal limits  LIPASE, BLOOD  CBC  PREGNANCY, URINE  POC URINE PREG, ED    EKG  EKG Interpretation None       Radiology No results  found.  Procedures Procedures (including critical care time)  EMERGENCY DEPARTMENT US RENAL EXAM  "Study: Limited Retroperitoneal Ultrasound of Kidneys"  INDICATIONS: Flank pain Long and short axis of both kidneys were obtained.   PERFORMED BY: Myself IMAGES ARCHIVED?: Yes LIMITATIONS: None VIEWS USED: Long axis and Short axis  INTERPRETATION: No Hydronephrosis, No Renal cyst, No Kidney stone    Medications Ordered in ED Medications  methocarbamol (ROBAXIN) tablet 750 mg (not administered)  ketorolac (TORADOL) 15 MG/ML injection 15 mg (15 mg Intravenous Given 03/18/17 1632)     Initial Impression / Assessment and Plan / ED Course  I have reviewed the triage vital signs and the nursing notes.  Pertinent labs & imaging results that were available during my care of the patient were reviewed by me and considered in my medical decision making (see chart for details).     Right flank pain most consistent with MSK the etiology. Patient with a history of degenerative disc disease. No red flags concerning for cauda equina. Bedside ultrasound without evidence of hydronephrosis or visualized stone. UA without evidence of infection to suggest pyelonephritis or hematuria to suggest a renal stone. Patient without evidence of biliary obstruction or pancreatitis on the labs. No right upper quadrant tenderness to palpation. Low suspicion for acute cholecystitis. Patient is postmenopausal and UPT was negative. Low suspicion for AAA.  Patient treated symptomatically with Toradol and Robaxin. Discussed other symptomatic management including massage therapy, stretching, heat therapy, topical creams.  The patient is safe for discharge with strict return precautions.   Final Clinical Impressions(s) / ED Diagnoses   Final diagnoses:  Strain of muscle, fascia and tendon of lower back, initial encounter   Disposition: Discharge  Condition: Good  I have discussed the results, Dx and Tx plan with  the patient who expressed understanding and agree(s) with the plan. Discharge instructions discussed at great length. The patient was given strict return precautions who verbalized understanding of the instructions. No further questions at time of discharge.    New Prescriptions   No medications on file    Follow Up: Primary care provider  Schedule an appointment as soon as possible for a visit  As needed      Siena Poehler, Amadeo Garnet, MD 03/18/17 1829

## 2017-03-29 IMAGING — DX DG CHEST 2V
2 series · 2 of 2 positions shown · non-contrast
Comparison: 11/01/2015

CLINICAL DATA: Awoke with central chest pain radiating to the back.
Shortness of breath.

EXAM:
CHEST  2 VIEW

[chest pa]
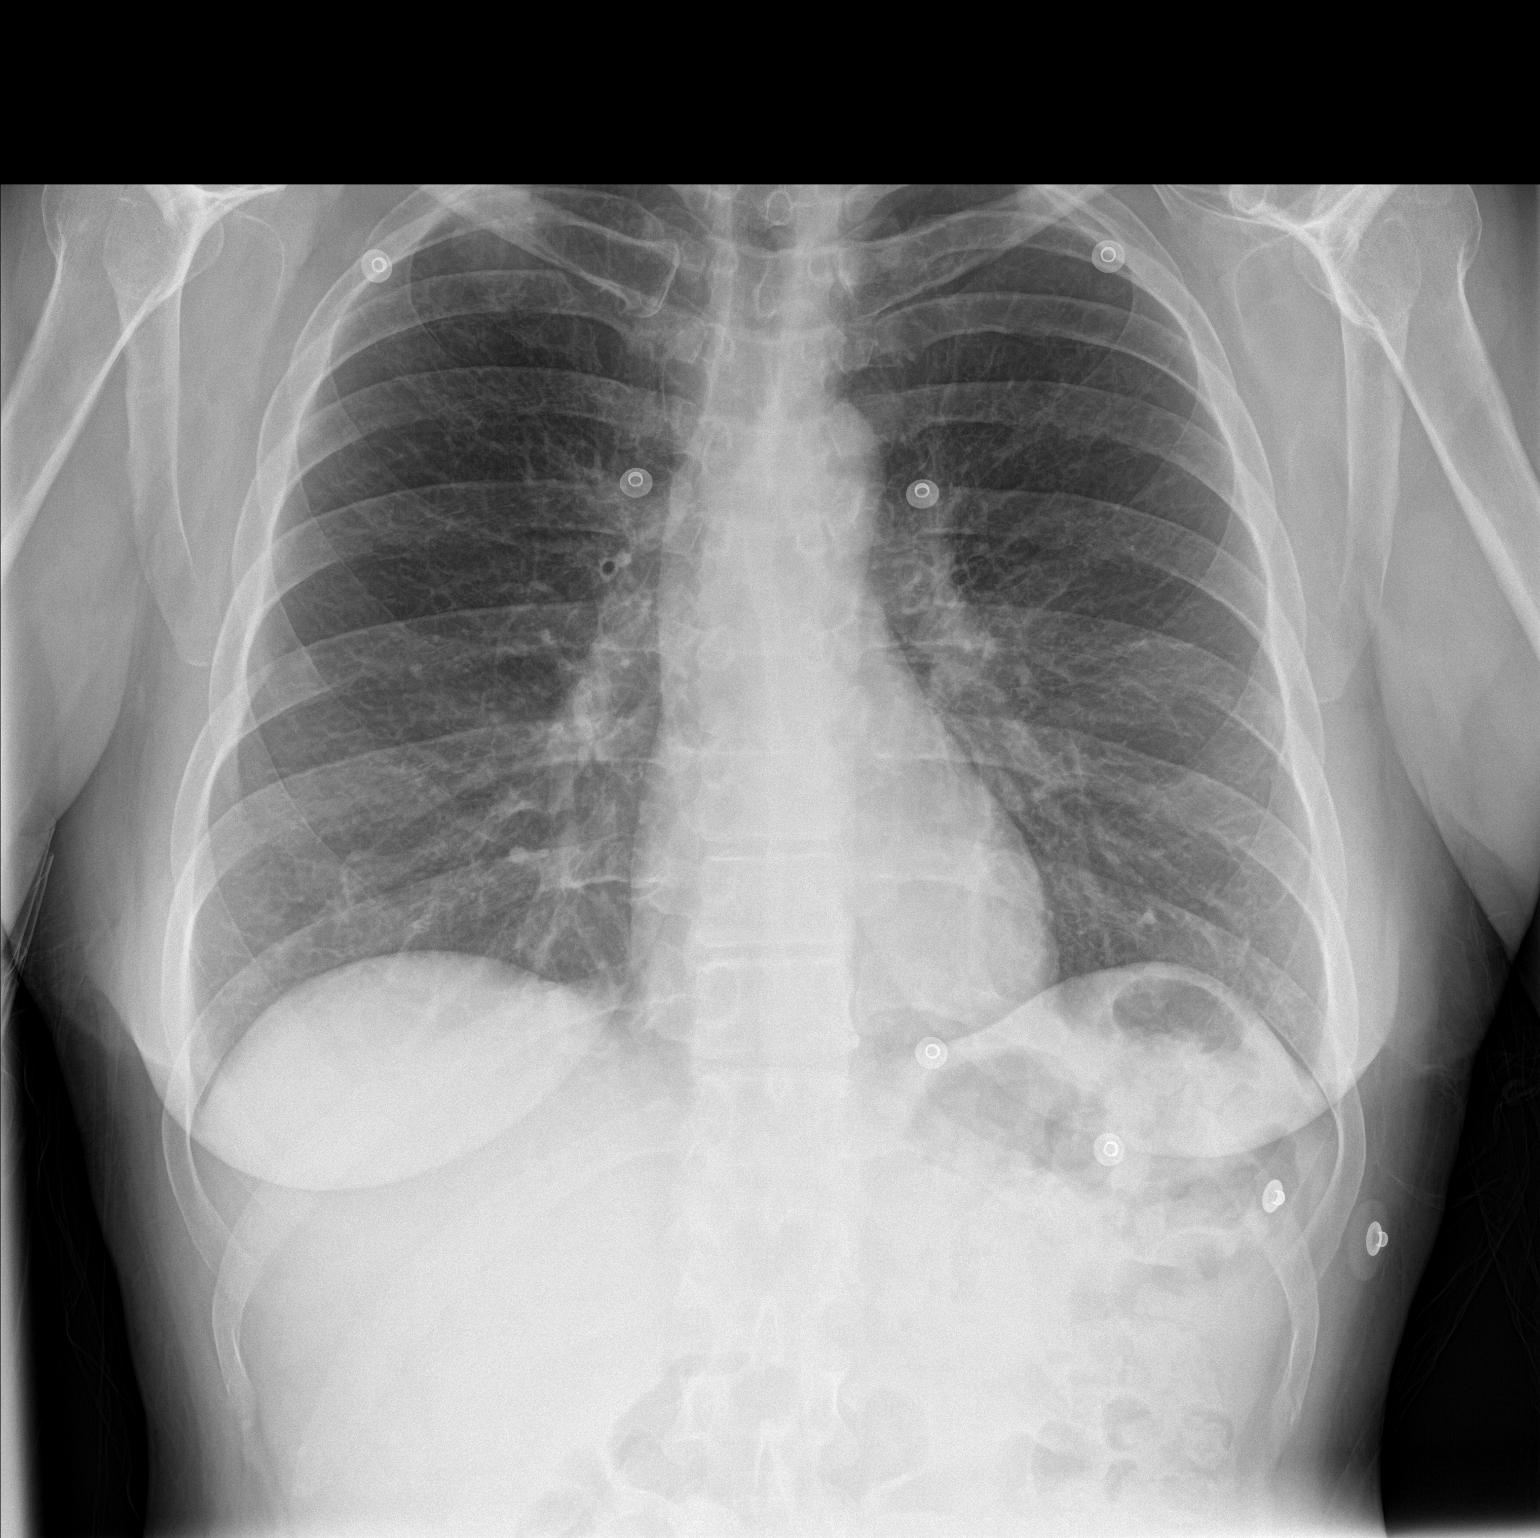

[chest lat]
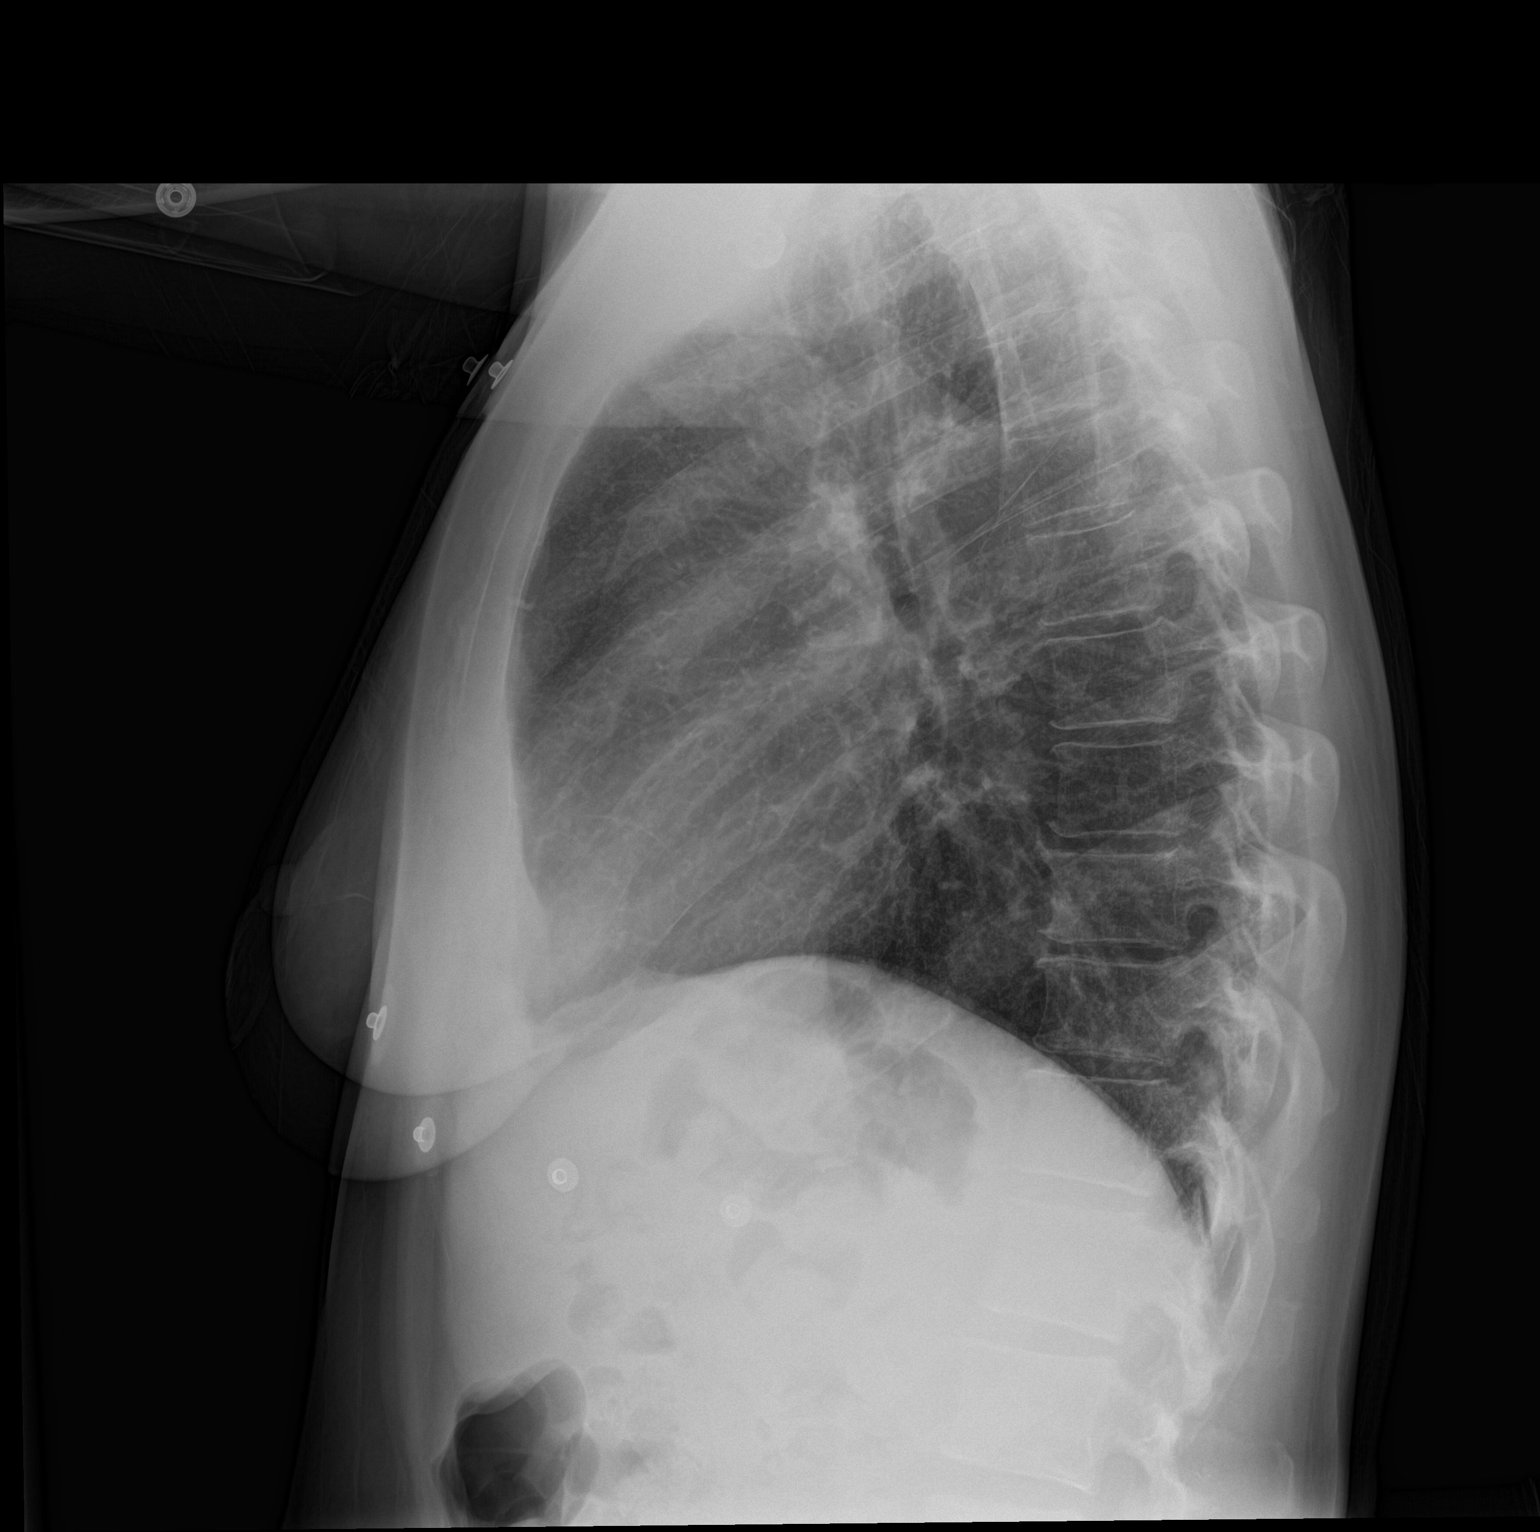

[2 of 2 positions shown; findings below may reference images not displayed]

FINDINGS: The cardiomediastinal contours are normal. Mild central bronchial
thickening. Scattered linear atelectasis in both lower lung zones.
Pulmonary vasculature is normal. No consolidation, pleural effusion,
or pneumothorax. No acute osseous abnormalities are seen. Hardware
in the cervical spine is partially included.
IMPRESSION: Mild bronchitic change and scattered bibasilar atelectasis.

## 2017-04-12 ENCOUNTER — Encounter: Payer: Self-pay | Admitting: Physical Medicine & Rehabilitation

## 2017-07-10 ENCOUNTER — Ambulatory Visit (INDEPENDENT_AMBULATORY_CARE_PROVIDER_SITE_OTHER): Payer: Self-pay | Admitting: Internal Medicine

## 2017-07-10 ENCOUNTER — Encounter: Payer: Self-pay | Admitting: Internal Medicine

## 2017-07-10 VITALS — BP 140/92 | HR 76 | Resp 14 | Ht 69.0 in | Wt 198.0 lb

## 2017-07-10 DIAGNOSIS — M5441 Lumbago with sciatica, right side: Secondary | ICD-10-CM

## 2017-07-10 DIAGNOSIS — M5442 Lumbago with sciatica, left side: Secondary | ICD-10-CM

## 2017-07-10 DIAGNOSIS — M545 Low back pain, unspecified: Secondary | ICD-10-CM

## 2017-07-10 DIAGNOSIS — F17200 Nicotine dependence, unspecified, uncomplicated: Secondary | ICD-10-CM

## 2017-07-10 DIAGNOSIS — G8929 Other chronic pain: Secondary | ICD-10-CM

## 2017-07-10 DIAGNOSIS — F32A Depression, unspecified: Secondary | ICD-10-CM | POA: Insufficient documentation

## 2017-07-10 DIAGNOSIS — M5489 Other dorsalgia: Secondary | ICD-10-CM

## 2017-07-10 DIAGNOSIS — F419 Anxiety disorder, unspecified: Secondary | ICD-10-CM

## 2017-07-10 DIAGNOSIS — F329 Major depressive disorder, single episode, unspecified: Secondary | ICD-10-CM

## 2017-07-10 DIAGNOSIS — F4310 Post-traumatic stress disorder, unspecified: Secondary | ICD-10-CM

## 2017-07-10 HISTORY — DX: Low back pain, unspecified: M54.50

## 2017-07-10 HISTORY — DX: Post-traumatic stress disorder, unspecified: F43.10

## 2017-07-10 HISTORY — DX: Other chronic pain: G89.29

## 2017-07-10 MED ORDER — PREGABALIN 100 MG PO CAPS: 100.0000 mg | ORAL_CAPSULE | Freq: Two times a day (BID) | ORAL | 6 refills | Status: DC

## 2017-07-10 NOTE — Progress Notes (Signed)
LCSW completed [facility] social determinants of health screening with pt. Pt reported no barriers/social determinants. Pt shared that she has been diagnosed with PTSD and depression in the past and that several doctors thought that her pain was related to her mental health. She shared frustrations that doctors don't seem to believe/take her seriously. Pt scheduled counseling appointment.
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
## 2017-07-13 ENCOUNTER — Ambulatory Visit: Payer: Self-pay | Admitting: Internal Medicine

## 2017-07-17 ENCOUNTER — Ambulatory Visit (INDEPENDENT_AMBULATORY_CARE_PROVIDER_SITE_OTHER): Payer: Self-pay | Admitting: Licensed Clinical Social Worker

## 2017-07-17 DIAGNOSIS — Z1331 Encounter for screening for depression: Secondary | ICD-10-CM

## 2017-07-17 DIAGNOSIS — Z1389 Encounter for screening for other disorder: Secondary | ICD-10-CM

## 2017-07-18 ENCOUNTER — Telehealth: Payer: Self-pay | Admitting: Internal Medicine

## 2017-07-18 NOTE — Telephone Encounter (Signed)
Spoke to patient about Statistician  1. Patient needs to complete and sign [employer] HIPAA form (and have it scanned in her documents) 2. Per Karena Addison at [employer] patient can provide a letter of support and send it along with application 3. Application should include forms filled out by physician, Rx, government-issued ID, and proof of income.  Application is at the front desk folder until patient comes in to complete. Once completed. It will be faxed to [facility] by [facility] staff
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
## 2017-07-20 NOTE — Telephone Encounter (Signed)
Patient came in on 07/19/17 and filled out application and dropped off all documentation needed to process the application. Application was faxed, scanned into patient's chart  and prep. For batch on 07/19/17

## 2017-07-24 ENCOUNTER — Ambulatory Visit (INDEPENDENT_AMBULATORY_CARE_PROVIDER_SITE_OTHER): Payer: Self-pay | Admitting: Licensed Clinical Social Worker

## 2017-07-24 DIAGNOSIS — F4310 Post-traumatic stress disorder, unspecified: Secondary | ICD-10-CM

## 2017-07-25 ENCOUNTER — Telehealth: Payer: Self-pay | Admitting: Internal Medicine

## 2017-07-25 NOTE — Telephone Encounter (Signed)
Discussion with LCSW, N, Knight, LCSW. Patient voiced interest in restarting Abilify and Fluoxetine as she has taken in past with good results controlling depression/anxiety. Will have Cherice call and see if can get any clarification of dosing from past.

## 2017-07-25 NOTE — Telephone Encounter (Signed)
Called patient and no answer and voicemail is full 

## 2017-07-25 NOTE — Telephone Encounter (Signed)
Spoke with patient. States yes she wants to restart her Abilify and Fluoxetine. Patient states she does not know the mg of the Abilify or Fluoxetine. States she gave that information to [facility]. Patient will try to get dosing information and give [place] a call back.
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
## 2017-07-26 NOTE — Telephone Encounter (Signed)
Spoke with [employer]. Informed that patient did give her dosing for Abilify and Fluoxetine. Patient was taking Abilify 15 mg and Fluoxetine 20 mg.

## 2017-07-27 MED ORDER — FLUOXETINE HCL 20 MG PO TABS: 20.0000 mg | ORAL_TABLET | Freq: Every day | ORAL | 3 refills | Status: DC

## 2017-07-27 MED ORDER — ARIPIPRAZOLE 15 MG PO TABS: 15.0000 mg | ORAL_TABLET | Freq: Every day | ORAL | 2 refills | Status: DC

## 2017-07-27 MED ORDER — ARIPIPRAZOLE 15 MG PO TABS: 15.0000 mg | ORAL_TABLET | Freq: Every day | ORAL | 11 refills | Status: DC

## 2017-07-27 NOTE — Telephone Encounter (Signed)
Spoke with patient . States she can afford the $25 from [facility]. Rx for Abilify sent to [facility]. Coupon at front desk. Patient will pick up on Monday morning. Verbalized understanding of Dr. Delrae Alfred wanting her to wait to get fluoxetine until she get Abilify.

## 2017-07-31 ENCOUNTER — Ambulatory Visit (INDEPENDENT_AMBULATORY_CARE_PROVIDER_SITE_OTHER): Payer: Self-pay | Admitting: Licensed Clinical Social Worker

## 2017-07-31 DIAGNOSIS — F4310 Post-traumatic stress disorder, unspecified: Secondary | ICD-10-CM
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
## 2017-08-07 ENCOUNTER — Other Ambulatory Visit: Payer: Self-pay | Admitting: Licensed Clinical Social Worker

## 2017-08-09 ENCOUNTER — Ambulatory Visit (INDEPENDENT_AMBULATORY_CARE_PROVIDER_SITE_OTHER): Payer: Self-pay | Admitting: Licensed Clinical Social Worker

## 2017-08-09 DIAGNOSIS — F4310 Post-traumatic stress disorder, unspecified: Secondary | ICD-10-CM

## 2017-08-14 ENCOUNTER — Other Ambulatory Visit: Payer: Self-pay | Admitting: Licensed Clinical Social Worker
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
## 2017-08-21 ENCOUNTER — Ambulatory Visit (INDEPENDENT_AMBULATORY_CARE_PROVIDER_SITE_OTHER): Payer: Self-pay | Admitting: Licensed Clinical Social Worker

## 2017-08-21 DIAGNOSIS — F4310 Post-traumatic stress disorder, unspecified: Secondary | ICD-10-CM
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
## 2017-09-10 ENCOUNTER — Ambulatory Visit: Payer: Self-pay | Admitting: Internal Medicine

## 2017-09-10 ENCOUNTER — Encounter: Payer: Self-pay | Admitting: Internal Medicine

## 2017-09-10 VITALS — BP 140/80 | HR 70 | Resp 12 | Ht 69.0 in | Wt 205.0 lb

## 2017-09-10 DIAGNOSIS — M5441 Lumbago with sciatica, right side: Secondary | ICD-10-CM

## 2017-09-10 DIAGNOSIS — M5442 Lumbago with sciatica, left side: Secondary | ICD-10-CM

## 2017-09-10 DIAGNOSIS — F329 Major depressive disorder, single episode, unspecified: Secondary | ICD-10-CM

## 2017-09-10 DIAGNOSIS — F17200 Nicotine dependence, unspecified, uncomplicated: Secondary | ICD-10-CM

## 2017-09-10 DIAGNOSIS — F32A Depression, unspecified: Secondary | ICD-10-CM

## 2017-09-10 DIAGNOSIS — G8929 Other chronic pain: Secondary | ICD-10-CM

## 2017-09-10 DIAGNOSIS — F4310 Post-traumatic stress disorder, unspecified: Secondary | ICD-10-CM

## 2017-09-10 DIAGNOSIS — Z23 Encounter for immunization: Secondary | ICD-10-CM

## 2017-09-10 DIAGNOSIS — F419 Anxiety disorder, unspecified: Secondary | ICD-10-CM

## 2017-09-10 NOTE — Patient Instructions (Signed)
[facility] on [address] for donut cushion
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
## 2017-09-11 ENCOUNTER — Other Ambulatory Visit: Payer: Self-pay | Admitting: Licensed Clinical Social Worker

## 2017-09-18 ENCOUNTER — Ambulatory Visit: Payer: Self-pay | Admitting: Licensed Clinical Social Worker

## 2017-09-18 DIAGNOSIS — F4310 Post-traumatic stress disorder, unspecified: Secondary | ICD-10-CM
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
## 2017-09-25 ENCOUNTER — Ambulatory Visit (INDEPENDENT_AMBULATORY_CARE_PROVIDER_SITE_OTHER): Payer: Self-pay | Admitting: Licensed Clinical Social Worker

## 2017-09-25 DIAGNOSIS — F4310 Post-traumatic stress disorder, unspecified: Secondary | ICD-10-CM
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
## 2017-10-02 ENCOUNTER — Other Ambulatory Visit: Payer: Self-pay | Admitting: Licensed Clinical Social Worker

## 2017-11-12 ENCOUNTER — Encounter: Payer: Self-pay | Admitting: Internal Medicine

## 2017-11-12 ENCOUNTER — Ambulatory Visit: Payer: Self-pay | Admitting: Internal Medicine

## 2017-11-12 VITALS — BP 122/84 | HR 72 | Resp 12 | Ht 69.0 in | Wt 208.0 lb

## 2017-11-12 DIAGNOSIS — F329 Major depressive disorder, single episode, unspecified: Secondary | ICD-10-CM

## 2017-11-12 DIAGNOSIS — M5441 Lumbago with sciatica, right side: Secondary | ICD-10-CM

## 2017-11-12 DIAGNOSIS — M5442 Lumbago with sciatica, left side: Secondary | ICD-10-CM

## 2017-11-12 DIAGNOSIS — G8929 Other chronic pain: Secondary | ICD-10-CM

## 2017-11-12 DIAGNOSIS — F32A Depression, unspecified: Secondary | ICD-10-CM

## 2017-11-12 MED ORDER — PREGABALIN 100 MG PO CAPS: ORAL_CAPSULE | ORAL | 6 refills | Status: DC

## 2017-11-12 NOTE — Progress Notes (Signed)
   Subjective:    Patient ID: Crystal Baker, female    DOB: February 18, 1966, 52 y.o.   MRN: [id number]  HPI   1.  Chronic Back Pain:  [name].  She goes 3 times weekly.  She feels it has helped everything but her lower back. Titrated herself up to 200 mg 3 times daily for past 2 weeks on this dose.   States the shooting pains into her right thigh is much better, but still with low back pain bilaterally.  2.  Bipolar/Depression:  Now at [facility].  Seeing psychiatrist and counselor there.  3rd appointment today.  Not crying anymore.  Sleeping okay--limited by pain.  Abilify still at 15 mg daily.  Fluoxetine has been titrated up to 60 mg daily per patient--[name]  Current Meds  Medication Sig  . ARIPiprazole (ABILIFY) 15 MG tablet Take 1 tablet (15 mg total) by mouth daily.  Marland Kitchen. FLUoxetine (PROZAC) 20 MG tablet Take 1 tablet (20 mg total) by mouth daily. (Patient taking differently: Take 20 mg by mouth. 3 by mouth daily)  . pregabalin (LYRICA) 100 MG capsule 2 caps by mouth 3 times daily  . [DISCONTINUED] pregabalin (LYRICA) 100 MG capsule Take 1 capsule (100 mg total) by mouth 2 (two) times daily.    Allergies  Allergen Reactions  . Naproxen Itching and Rash    Palpitations in high doses  . Prednisone Palpitations  . Tramadol Itching and Rash    Palpitations    Past Medical History:  Diagnosis Date  . Anxiety    in the past  . Anxiety and depression   . Arthritis   . Chronic bilateral low back pain 07/10/2017  . Depression    in the past.  Depression and anxiety starting in childhood. No counseling until 2015   . PTSD (post-traumatic stress disorder) 07/10/2017  . Syncope    Past Surgical History:  Procedure Laterality Date  . BACK SURGERY    . NECK SURGERY        Review of Systems     Objective:   Physical Exam  Moves slowly and gingerly to exam table  Lungs:  CTA CV: RRR without murmur or rub, radial pulses normal and  equal Back:  Tender over sacral paraspinous musculature.   Neuro:  Motor 5/5 throughout LE.  DTRs 2+/4 left patella and ankle, possibly 3+/4 patella and ankle on right.         Assessment & Plan:  1.  Chronic Back Pain:  Has been on max of Lyrica 600 mg/24 hours for maybe 2 weeks.  Will see if continues to see improvement in pain. She will call in fax for [employer] to change dosing. Referral in meantime to [facility] Clinic at [name] as patient feels she can get transportation there. Still waiting to hear [name] [place]  2  Depression/Bipolar Disorder:  As per [facility].

## 2017-11-13 ENCOUNTER — Other Ambulatory Visit: Payer: Self-pay | Admitting: Licensed Clinical Social Worker

## 2018-01-15 ENCOUNTER — Telehealth: Payer: Self-pay | Admitting: Internal Medicine

## 2018-01-17 ENCOUNTER — Other Ambulatory Visit: Payer: Self-pay

## 2018-01-17 DIAGNOSIS — G8929 Other chronic pain: Secondary | ICD-10-CM

## 2018-01-17 DIAGNOSIS — M5441 Lumbago with sciatica, right side: Principal | ICD-10-CM

## 2018-01-17 DIAGNOSIS — M5442 Lumbago with sciatica, left side: Principal | ICD-10-CM

## 2018-01-17 MED ORDER — PREGABALIN 100 MG PO CAPS: ORAL_CAPSULE | ORAL | 6 refills | Status: DC

## 2018-02-10 ENCOUNTER — Encounter (HOSPITAL_COMMUNITY): Payer: Self-pay | Admitting: Emergency Medicine

## 2018-02-10 ENCOUNTER — Emergency Department (HOSPITAL_COMMUNITY): Payer: Self-pay

## 2018-02-10 ENCOUNTER — Emergency Department (HOSPITAL_COMMUNITY)
Admission: EM | Admit: 2018-02-10 | Discharge: 2018-02-10 | Disposition: A | Discharge status: Home / Self Care | Payer: Self-pay | Attending: Emergency Medicine | Admitting: Emergency Medicine

## 2018-02-10 ENCOUNTER — Other Ambulatory Visit: Payer: Self-pay

## 2018-02-10 DIAGNOSIS — F17210 Nicotine dependence, cigarettes, uncomplicated: Secondary | ICD-10-CM | POA: Insufficient documentation

## 2018-02-10 DIAGNOSIS — Z79899 Other long term (current) drug therapy: Secondary | ICD-10-CM | POA: Insufficient documentation

## 2018-02-10 DIAGNOSIS — R0781 Pleurodynia: Secondary | ICD-10-CM | POA: Insufficient documentation

## 2018-02-10 DIAGNOSIS — N3000 Acute cystitis without hematuria: Secondary | ICD-10-CM | POA: Insufficient documentation

## 2018-02-10 LAB — LIPASE, BLOOD: Lipase: 32 U/L (ref 11–51)

## 2018-02-10 LAB — CBC
HCT: 42.8 % (ref 36.0–46.0)
HEMOGLOBIN: 14.1 g/dL (ref 12.0–15.0)
MCH: 31.8 pg (ref 26.0–34.0)
MCHC: 32.9 g/dL (ref 30.0–36.0)
MCV: 96.4 fL (ref 78.0–100.0)
Platelets: 219 10*3/uL (ref 150–400)
RBC: 4.44 MIL/uL (ref 3.87–5.11)
RDW: 12.8 % (ref 11.5–15.5)
WBC: 7.8 10*3/uL (ref 4.0–10.5)

## 2018-02-10 LAB — D-DIMER, QUANTITATIVE: D-Dimer, Quant: 0.44 ug/mL-FEU (ref 0.00–0.50)

## 2018-02-10 LAB — URINALYSIS, ROUTINE W REFLEX MICROSCOPIC
Bilirubin Urine: NEGATIVE
GLUCOSE, UA: NEGATIVE mg/dL
Ketones, ur: NEGATIVE mg/dL
NITRITE: NEGATIVE
Protein, ur: NEGATIVE mg/dL
SPECIFIC GRAVITY, URINE: 1.018 (ref 1.005–1.030)
pH: 5 (ref 5.0–8.0)

## 2018-02-10 LAB — COMPREHENSIVE METABOLIC PANEL
ALBUMIN: 3.8 g/dL (ref 3.5–5.0)
ALT: 36 U/L (ref 14–54)
ANION GAP: 9 (ref 5–15)
AST: 33 U/L (ref 15–41)
Alkaline Phosphatase: 86 U/L (ref 38–126)
BILIRUBIN TOTAL: 0.7 mg/dL (ref 0.3–1.2)
BUN: 8 mg/dL (ref 6–20)
CALCIUM: 9.4 mg/dL (ref 8.9–10.3)
CO2: 24 mmol/L (ref 22–32)
Chloride: 103 mmol/L (ref 101–111)
Creatinine, Ser: 0.87 mg/dL (ref 0.44–1.00)
GFR calc Af Amer: >60 mL/min (ref >60)
GLUCOSE: 87 mg/dL (ref 65–99)
Potassium: 4.1 mmol/L (ref 3.5–5.1)
Sodium: 136 mmol/L (ref 135–145)
TOTAL PROTEIN: 7.4 g/dL (ref 6.5–8.1)

## 2018-02-10 LAB — I-STAT TROPONIN, ED: TROPONIN I, POC: 0 ng/mL (ref 0.00–0.08)

## 2018-02-10 LAB — I-STAT BETA HCG BLOOD, ED (MC, WL, AP ONLY): I-stat hCG, quantitative: <5 m[IU]/mL (ref <5)

## 2018-02-10 MED ORDER — HYDROCODONE-ACETAMINOPHEN 5-325 MG PO TABS
1.0000 | ORAL_TABLET | Freq: Once | ORAL | Status: AC
  Administered 2018-02-10: 1 via ORAL
  Filled 2018-02-10: qty 1

## 2018-02-10 MED ORDER — CEPHALEXIN 500 MG PO CAPS: 500.0000 mg | ORAL_CAPSULE | Freq: Four times a day (QID) | ORAL | 0 refills | Status: DC

## 2018-02-10 MED ORDER — HYDROCODONE-ACETAMINOPHEN 5-325 MG PO TABS: 1.0000 | ORAL_TABLET | ORAL | 0 refills | Status: DC | PRN

## 2018-02-10 NOTE — Discharge Instructions (Signed)
Take pain medicine as needed for severe pain Take Keflex twice a day for 5 days Use a heating pad for sore muscles - use for 20 minutes several times a day Return for worsening symptoms

## 2018-02-10 NOTE — ED Provider Notes (Signed)
[facility] EMERGENCY DEPARTMENT Provider Note   CSN: [id number] Arrival date & time: 02/10/18  1358     History   Chief Complaint Chief Complaint  Patient presents with  . Abdominal Pain    HPI Crystal Baker is a 52 y.o. female who presents with right lower rib pain.  Past medical history significant for chronic back pain, anxiety, depression, history of UTI.  The patient states over the past 4-5 days she has had gradually worsening right lower rib pain.  The pain is worse with movement and inspiration.  She has had a cold for the past week as well and a cough which aggravates her pain.  She is worried that she may have pneumonia.  She has never had pain like this before.  She has not taken anything for pain.  She denies abdominal pain, nausea, vomiting, diarrhea, constipation, dysuria, hematuria.  Pain is not associated with p.o. intake.  She denies prior abdominal surgeries.  She has had kidney infections before and states that this does not feel similar.  She has chronic urinary frequency and has not had a change in this.  HPI  Past Medical History:  Diagnosis Date  . Anxiety    in the past  . Anxiety and depression   . Arthritis   . Chronic bilateral low back pain 07/10/2017  . Depression    in the past.  Depression and anxiety starting in childhood. No counseling until 2015   . PTSD (post-traumatic stress disorder) 07/10/2017  . Syncope     Patient Active Problem List   Diagnosis Date Noted  . Chronic bilateral low back pain 07/10/2017  . PTSD (post-traumatic stress disorder) 07/10/2017  . Depression   . Chest pain 03/29/2016  . Syncope 03/29/2016  . Tobacco use disorder 03/29/2016  . Anxiety and depression 03/29/2016  . Interscapular pain 03/29/2016    Past Surgical History:  Procedure Laterality Date  . BACK SURGERY    . NECK SURGERY       OB History    Gravida  2   Para      Term      Preterm      AB      Living  2     SAB        TAB      Ectopic      Multiple      Live Births  2            Home Medications    Prior to Admission medications   Medication Sig Start Date End Date Taking? Authorizing Provider  ARIPiprazole (ABILIFY) 15 MG tablet Take 1 tablet (15 mg total) by mouth daily. 07/27/17   Julieanne Manson, MD  FLUoxetine (PROZAC) 20 MG tablet Take 1 tablet (20 mg total) by mouth daily. Patient taking differently: Take 20 mg by mouth. 3 by mouth daily 07/27/17   Julieanne Manson, MD  pregabalin (LYRICA) 100 MG capsule 2 caps by mouth 3 times daily 01/17/18   Julieanne Manson, MD    Family History Family History  Problem Relation Age of Onset  . Cancer Sister        Probably oral cancer    Social History Social History   Tobacco Use  . Smoking status: Current Every Day Smoker    Packs/day: 1.50    Years: 35.00    Pack years: 52.50    Types: Cigarettes  . Smokeless tobacco: Never Used  Substance Use Topics  .  Alcohol use: No    Alcohol/week: 0.0 oz  . Drug use: Yes    Types: Marijuana    Comment: Tried to help sleep     Allergies   Naproxen; Prednisone; and Tramadol   Review of Systems Review of Systems  Constitutional: Negative for chills and fever.  Respiratory: Positive for cough. Negative for shortness of breath and wheezing.   Cardiovascular: Positive for chest pain (With breathing).  Gastrointestinal: Negative for abdominal pain, diarrhea, nausea and vomiting.  Genitourinary: Positive for frequency (Chronic). Negative for dysuria, flank pain and pelvic pain.  All other systems reviewed and are negative.    Physical Exam Updated Vital Signs BP 127/83 (BP Location: Right Arm)   Pulse 67   Temp 98.2 F (36.8 C) (Oral)   Resp 18   Ht 5\' 10"  (1.778 m)   Wt 94.3 kg (208 lb)   SpO2 96%   BMI 29.84 kg/m   Physical Exam  Constitutional: She is oriented to person, place, and time. She appears well-developed and well-nourished. No distress.  HENT:   Head: Normocephalic and atraumatic.  Eyes: Pupils are equal, round, and reactive to light. Conjunctivae are normal. Right eye exhibits no discharge. Left eye exhibits no discharge. No scleral icterus.  Neck: Normal range of motion.  Cardiovascular: Normal rate and regular rhythm.  Pulmonary/Chest: Effort normal and breath sounds normal. No respiratory distress. She exhibits tenderness (Right lower rib tenderness).  Abdominal: Soft. Bowel sounds are normal. She exhibits no distension and no mass. There is tenderness (Right upper quadrant tenderness with deep palpation). There is no rebound and no guarding. No hernia.  No CVA tenderness  Neurological: She is alert and oriented to person, place, and time.  Skin: Skin is warm and dry.  Psychiatric: She has a normal mood and affect. Her behavior is normal.  Nursing note and vitals reviewed.    ED Treatments / Results  Labs (all labs ordered are listed, but only abnormal results are displayed) Labs Reviewed  URINALYSIS, ROUTINE W REFLEX MICROSCOPIC - Abnormal; Notable for the following components:      Result Value   APPearance HAZY (*)    Hgb urine dipstick SMALL (*)    Leukocytes, UA LARGE (*)    Bacteria, UA MANY (*)    Squamous Epithelial / LPF 0-5 (*)    All other components within normal limits  URINE CULTURE  LIPASE, BLOOD  COMPREHENSIVE METABOLIC PANEL  CBC  D-DIMER, QUANTITATIVE (NOT AT [facility])  I-STAT BETA HCG BLOOD, ED ([facility], [facility], [facility] ONLY)  I-STAT TROPONIN, ED    EKG None  Radiology Dg Chest 2 View  Result Date: 02/10/2018 CLINICAL DATA:  Shortness of breath x 4-5 days EXAM: CHEST - 2 VIEW COMPARISON:  09/06/2016 FINDINGS: Lungs are clear.  No pleural effusion or pneumothorax. The heart is normal in size. Visualized osseous structures are within normal limits. Cervical spine fixation hardware, incompletely visualized. IMPRESSION: No evidence of acute cardiopulmonary disease. Electronically Signed   By: Charline BillsSriyesh  Krishnan  M.D.   On: 02/10/2018 16:04    Procedures Procedures (including critical care time)  Medications Ordered in ED Medications  HYDROcodone-acetaminophen (NORCO/VICODIN) 5-325 MG per tablet 1 tablet (1 tablet Oral Given 02/10/18 1803)     Initial Impression / Assessment and Plan / ED Course  I have reviewed the triage vital signs and the nursing notes.  Pertinent labs & imaging results that were available during my care of the patient were reviewed by me and considered in my  medical decision making (see chart for details).  52 year old female presents with inspiratory chest pain.  Question musculoskeletal pain versus PE versus atypical kidney infection.  Her vital signs are normal.  Her EKG is normal and her chest x-ray is negative.  Her CBC, CMP, lipase are normal.  Troponin is normal.  Her urine shows large leukocytes, many bacteria, too numerous to count white blood cells.  A culture was sent.  She states it does not feel like a UTI to her.  Will provide pain control and add on a d-dimer to rule out PE.  D-dimer is negative.  Patient reports feeling better after pain medication.  We will treat for musculoskeletal pain and questionable UTI.  She was given a prescription for Keflex and return precautions.  Final Clinical Impressions(s) / ED Diagnoses   Final diagnoses:  Rib pain on right side  Acute cystitis without hematuria    ED Discharge Orders    None       Bethel Born, PA-C 02/10/18 1904    Mancel Bale, MD 02/11/18 1249

## 2018-02-10 NOTE — ED Triage Notes (Signed)
Pt states 4-6 days of pain under right breast, (pointing to upper qaudrant of abdomen). Denies nausea, vomiting or diarrhea, denies chest pain. Pain is constant but worse with coughing or deep breathing.

## 2018-02-11 ENCOUNTER — Ambulatory Visit: Payer: Self-pay | Admitting: Internal Medicine

## 2018-02-11 ENCOUNTER — Encounter: Payer: Self-pay | Admitting: Internal Medicine

## 2018-02-11 VITALS — BP 122/84 | HR 78 | Resp 12 | Ht 69.0 in | Wt 208.0 lb

## 2018-02-11 DIAGNOSIS — Z23 Encounter for immunization: Secondary | ICD-10-CM

## 2018-02-11 DIAGNOSIS — Z0000 Encounter for general adult medical examination without abnormal findings: Secondary | ICD-10-CM

## 2018-02-11 DIAGNOSIS — R195 Other fecal abnormalities: Secondary | ICD-10-CM

## 2018-02-11 DIAGNOSIS — H547 Unspecified visual loss: Secondary | ICD-10-CM

## 2018-02-11 DIAGNOSIS — H9193 Unspecified hearing loss, bilateral: Secondary | ICD-10-CM

## 2018-02-11 DIAGNOSIS — J9801 Acute bronchospasm: Secondary | ICD-10-CM

## 2018-02-11 DIAGNOSIS — Z1231 Encounter for screening mammogram for malignant neoplasm of breast: Secondary | ICD-10-CM

## 2018-02-11 DIAGNOSIS — Z1239 Encounter for other screening for malignant neoplasm of breast: Secondary | ICD-10-CM

## 2018-02-11 DIAGNOSIS — Z124 Encounter for screening for malignant neoplasm of cervix: Secondary | ICD-10-CM

## 2018-02-11 DIAGNOSIS — M79674 Pain in right toe(s): Secondary | ICD-10-CM

## 2018-02-11 DIAGNOSIS — J209 Acute bronchitis, unspecified: Secondary | ICD-10-CM

## 2018-02-11 DIAGNOSIS — J302 Other seasonal allergic rhinitis: Secondary | ICD-10-CM

## 2018-02-11 MED ORDER — ALBUTEROL SULFATE (2.5 MG/3ML) 0.083% IN NEBU
2.5000 mg | INHALATION_SOLUTION | Freq: Once | RESPIRATORY_TRACT | Status: AC
  Administered 2018-02-11: 2.5 mg via RESPIRATORY_TRACT

## 2018-02-11 MED ORDER — ALBUTEROL SULFATE HFA 108 (90 BASE) MCG/ACT IN AERS: 2.0000 | INHALATION_SPRAY | Freq: Four times a day (QID) | RESPIRATORY_TRACT | 0 refills | Status: DC | PRN

## 2018-02-11 MED ORDER — CETIRIZINE HCL 10 MG PO TABS: 10.0000 mg | ORAL_TABLET | Freq: Every day | ORAL | 11 refills | Status: DC

## 2018-02-11 MED ORDER — AZITHROMYCIN 250 MG PO TABS: ORAL_TABLET | ORAL | 0 refills | Status: DC

## 2018-02-11 NOTE — Patient Instructions (Addendum)
Can google "advance directives, [place]"  And bring up form from [place]. Print and fill out Or can go to "5 wishes"  Which is also in Spanish and fill out--this costs $5--perhaps easier to use. Designate a CytogeneticistMedical Power of Attorney to speak for you if you are unable to speak for yourself when ill or injured   Pick a day to quit with your boyfriend, get rid of all smoking paraphernalia that day.  Tobacco Cessation:   1800QUITNOW or [phone], the former for support and possibly free nicotine patches/gum and support; the latter for [facility] Smoking cessation class. Get rid of all smoking supplies:  Cigarettes, lighters, ashtrays--no stashes just in case at home if you are serious.
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
## 2018-02-12 LAB — URINE CULTURE

## 2018-02-12 LAB — URIC ACID: URIC ACID: 4.5 mg/dL (ref 2.5–7.1)

## 2018-02-13 ENCOUNTER — Telehealth: Payer: Self-pay | Admitting: Emergency Medicine

## 2018-02-13 NOTE — Progress Notes (Signed)
ED Antimicrobial Stewardship Positive Culture Follow Up   Pollyann SavoyKimberly Goree is an 52 y.o. female who presented to [facility] on 02/10/2018 with a chief complaint of  Chief Complaint  Patient presents with  . Abdominal Pain    Recent Results (from the past 720 hour(s))  Urine culture     Status: Abnormal   Collection Time: 02/10/18  6:06 PM  Result Value Ref Range Status   Specimen Description URINE, CLEAN CATCH  Final   Special Requests   Final    NONE Performed at [facility], [address] [place], [place] [address]    Culture >=100,000 COLONIES/mL ENTEROBACTER AEROGENES (A)  Final   Report Status 02/12/2018 FINAL  Final   Organism ID, Bacteria ENTEROBACTER AEROGENES (A)  Final      Susceptibility   Enterobacter aerogenes - MIC*    CEFAZOLIN >=64 RESISTANT Resistant     CEFTRIAXONE <=1 SENSITIVE Sensitive     CIPROFLOXACIN <=0.25 SENSITIVE Sensitive     GENTAMICIN <=1 SENSITIVE Sensitive     IMIPENEM 2 SENSITIVE Sensitive     NITROFURANTOIN <=16 SENSITIVE Sensitive     TRIMETH/SULFA <=20 SENSITIVE Sensitive     PIP/TAZO <=4 SENSITIVE Sensitive     * >=100,000 COLONIES/mL ENTEROBACTER AEROGENES    [x]  Treated with cephalexin, organism resistant to prescribed antimicrobial []  Patient discharged originally without antimicrobial agent and treatment is now indicated  New antibiotic prescription: Stop cephalexin.  If having any symptoms, nitrofurantoin 100 mg BID X 5 days If no symptoms, no antibiotic.   ED Provider: Mathews RobinsonsJessica Mitchell, PA-C   Sharin MonsEmily Sinclair, PharmD, BCPS PGY2 Infectious Diseases Pharmacy Resident Pager: [phone]  02/13/2018, 9:14 AM

## 2018-02-13 NOTE — Progress Notes (Deleted)
ED Antimicrobial Stewardship Positive Culture Follow Up   Crystal Baker is an 52 y.o. female who presented to [facility] on 02/10/2018 with a chief complaint of  Chief Complaint  Patient presents with  . Abdominal Pain    Recent Results (from the past 720 hour(s))  Urine culture     Status: Abnormal   Collection Time: 02/10/18  6:06 PM  Result Value Ref Range Status   Specimen Description URINE, CLEAN CATCH  Final   Special Requests   Final    NONE Performed at [facility], [address] [place], [place] [address]    Culture >=100,000 COLONIES/mL ENTEROBACTER AEROGENES (A)  Final   Report Status 02/12/2018 FINAL  Final   Organism ID, Bacteria ENTEROBACTER AEROGENES (A)  Final      Susceptibility   Enterobacter aerogenes - MIC*    CEFAZOLIN >=64 RESISTANT Resistant     CEFTRIAXONE <=1 SENSITIVE Sensitive     CIPROFLOXACIN <=0.25 SENSITIVE Sensitive     GENTAMICIN <=1 SENSITIVE Sensitive     IMIPENEM 2 SENSITIVE Sensitive     NITROFURANTOIN <=16 SENSITIVE Sensitive     TRIMETH/SULFA <=20 SENSITIVE Sensitive     PIP/TAZO <=4 SENSITIVE Sensitive     * >=100,000 COLONIES/mL ENTEROBACTER AEROGENES   1/2 blood cultures positive for coag negative staph. Possible contaminant.   Try to reach patient again. If feeling abnormal in any way, please return for evaluation.  If feeling back to normal, ensure follow-up with PCP.   ED Provider: Mathews RobinsonsJessica Mitchell, PA-C  Sharin MonsEmily Sinclair, PharmD, BCPS PGY2 Infectious Diseases Pharmacy Resident Pager: [phone]  02/13/2018, 9:17 AM

## 2018-02-13 NOTE — Telephone Encounter (Signed)
Post ED Visit - Positive Culture Follow-up: Successful Patient Follow-Up  Culture assessed and recommendations reviewed by: []  Enzo BiNathan Batchelder, Pharm.D. []  Celedonio MiyamotoJeremy Frens, Pharm.D., BCPS AQ-ID []  Garvin FilaMike Maccia, Pharm.D., BCPS []  Georgina PillionElizabeth Martin, Pharm.D., BCPS []  [place]h Pham, [place].D., BCPS, AAHIVP []  Estella HuskMichelle Turner, Pharm.D., BCPS, AAHIVP []  Lysle Pearlachel Rumbarger, PharmD, BCPS []  Casilda Carlsaylor Stone, PharmD, BCPS []  Pollyann SamplesAndy Johnston, PharmD, BCPS Sharin MonsEmily Sinclair PharmD  Positive urine culture  []  Patient discharged without antimicrobial prescription and treatment is now indicated []  Organism is resistant to prescribed ED discharge antimicrobial []  Patient with positive blood cultures  Changes discussed with ED provider: Mathews RobinsonsJessica Mitchell PA New antibiotic prescription stop Cephalexin,if patient still with symptoms Start Nitrofurantoin 100 mg po bid x 5 days  Attempting to contact patient   Berle MullMiller, Treyvone Chelf 02/13/2018, 3:15 PM

## 2018-02-14 LAB — CYTOLOGY - PAP

## 2018-02-19 NOTE — Telephone Encounter (Signed)
Error

## 2018-04-03 NOTE — Progress Notes (Signed)
Referral,Demographics and OV notes faxed to [facility]. Facility ill contact patient and schedule appointment

## 2018-04-04 NOTE — Progress Notes (Signed)
Referral, OV notes and imaging and demographics faxed to [facility]. They ill contact patient to schedule appointment.

## 2018-08-12 ENCOUNTER — Ambulatory Visit: Payer: Self-pay | Admitting: Internal Medicine

## 2020-02-10 ENCOUNTER — Other Ambulatory Visit: Payer: Self-pay

## 2020-02-10 ENCOUNTER — Emergency Department (HOSPITAL_COMMUNITY): Payer: Medicaid Other

## 2020-02-10 ENCOUNTER — Encounter (HOSPITAL_COMMUNITY): Payer: Self-pay | Admitting: *Deleted

## 2020-02-10 DIAGNOSIS — R0789 Other chest pain: Secondary | ICD-10-CM | POA: Insufficient documentation

## 2020-02-10 DIAGNOSIS — F17210 Nicotine dependence, cigarettes, uncomplicated: Secondary | ICD-10-CM | POA: Diagnosis not present

## 2020-02-10 DIAGNOSIS — Z79899 Other long term (current) drug therapy: Secondary | ICD-10-CM | POA: Insufficient documentation

## 2020-02-10 NOTE — ED Triage Notes (Signed)
Pt arrives ambulatory to triage, reporting right sided pain that is worse to twisting, cough, and deep breathing for about a month. Pain over the right lateral rib and just under the right breast. Denies injury.

## 2020-02-11 ENCOUNTER — Emergency Department (HOSPITAL_COMMUNITY)
Admission: EM | Admit: 2020-02-11 | Discharge: 2020-02-11 | Disposition: A | Discharge status: Home / Self Care | Payer: Medicaid Other | Attending: Emergency Medicine | Admitting: Emergency Medicine

## 2020-02-11 DIAGNOSIS — Z8781 Personal history of (healed) traumatic fracture: Secondary | ICD-10-CM

## 2020-02-11 DIAGNOSIS — R0789 Other chest pain: Secondary | ICD-10-CM

## 2020-02-11 MED ORDER — METHOCARBAMOL 500 MG PO TABS: 500.0000 mg | ORAL_TABLET | Freq: Two times a day (BID) | ORAL | 0 refills | Status: DC | PRN

## 2020-02-11 MED ORDER — LIDOCAINE 5 % EX PTCH
1.0000 | MEDICATED_PATCH | CUTANEOUS | Status: DC
  Filled 2020-02-11: qty 1

## 2020-02-11 MED ORDER — METHOCARBAMOL 500 MG PO TABS
500.0000 mg | ORAL_TABLET | Freq: Once | ORAL | Status: AC
  Administered 2020-02-11: 500 mg via ORAL
  Filled 2020-02-11: qty 1

## 2020-02-11 MED ORDER — HYDROCODONE-ACETAMINOPHEN 5-325 MG PO TABS
1.0000 | ORAL_TABLET | Freq: Once | ORAL | Status: AC
  Administered 2020-02-11: 1 via ORAL
  Filled 2020-02-11: qty 1

## 2020-02-11 MED ORDER — HYDROCODONE-ACETAMINOPHEN 5-325 MG PO TABS: 1.0000 | ORAL_TABLET | Freq: Four times a day (QID) | ORAL | 0 refills | Status: DC | PRN

## 2020-02-11 MED ORDER — LIDOCAINE 5 % EX PTCH: 1.0000 | MEDICATED_PATCH | CUTANEOUS | 0 refills | Status: DC

## 2020-02-11 NOTE — Discharge Instructions (Signed)
We recommend alternation of ice and heat to areas of pain.  You may use topical magnesium cream as well as Lidoderm patches for pain control.  Take Norco and Robaxin as needed for management of severe pain.  Do not drive or drink alcohol after taking these medications as it may make you drowsy and impair your judgment.  Follow-up with your primary care doctor if pain persists.

## 2020-02-11 NOTE — ED Provider Notes (Signed)
[facility]-EMERGENCY DEPT Provider Note   CSN: [id number] Arrival date & time: 02/10/20  2024     History Chief Complaint  Patient presents with  . Rib pain    Crystal Baker is a 54 y.o. female.   54 year old female presents to the emergency department for right-sided chest wall pain.  She states that she has been experiencing soreness under her right breast x1 month.  Denies any known trauma or injury preceding symptoms.  Went to cough the other day and pain significantly worsened.  Reports a sharp pain with breathing, twisting, coughing.  She has previously been using ice and heat for symptoms.  Denies taking any over-the-counter medications for pain.  No hemoptysis, syncope or near syncope, fevers, leg swelling, recent surgeries or hospitalizations.  Denies sick contacts.  The history is provided by the patient. No language interpreter was used.       Past Medical History:  Diagnosis Date  . Anxiety    in the past  . Anxiety and depression   . Arthritis   . Chronic bilateral low back pain 07/10/2017  . Depression    in the past.  Depression and anxiety starting in childhood. No counseling until 2015   . PTSD (post-traumatic stress disorder) 07/10/2017  . PTSD (post-traumatic stress disorder)   . Syncope     Patient Active Problem List   Diagnosis Date Noted  . Chronic bilateral low back pain 07/10/2017  . PTSD (post-traumatic stress disorder) 07/10/2017  . Depression   . Chest pain 03/29/2016  . Syncope 03/29/2016  . Tobacco use disorder 03/29/2016  . Anxiety and depression 03/29/2016  . Interscapular pain 03/29/2016    Past Surgical History:  Procedure Laterality Date  . BACK SURGERY    . NECK SURGERY       OB History    Gravida  2   Para      Term      Preterm      AB      Living  2     SAB      TAB      Ectopic      Multiple      Live Births  2           Family History  Problem Relation Age of Onset  .  Cancer Sister        Probably oral cancer    Social History   Tobacco Use  . Smoking status: Current Every Day Smoker    Packs/day: 0.50    Years: 35.00    Pack years: 17.50    Types: Cigarettes  . Smokeless tobacco: Never Used  Substance Use Topics  . Alcohol use: No    Alcohol/week: 0.0 standard drinks  . Drug use: Yes    Types: Marijuana    Comment: Tried for pain in 2018, but made it worse, so stopped.    Home Medications Prior to Admission medications   Medication Sig Start Date End Date Taking? Authorizing Provider  albuterol (PROVENTIL HFA;VENTOLIN HFA) 108 (90 Base) MCG/ACT inhaler Inhale 2 puffs into the lungs every 6 (six) hours as needed for wheezing or shortness of breath. 02/11/18   Julieanne Manson, MD  ARIPiprazole (ABILIFY) 15 MG tablet Take 1 tablet (15 mg total) by mouth daily. 07/27/17   Julieanne Manson, MD  azithromycin (ZITHROMAX) 250 MG tablet 2 tabs by mouth today, then 1 tab by mouth daily for 4 more days 02/11/18   Julieanne Manson,  MD  buPROPion (WELLBUTRIN XL) 150 MG 24 hr tablet Take 150 mg by mouth daily.    [provider]  cephALEXin (KEFLEX) 500 MG capsule Take 1 capsule (500 mg total) by mouth 4 (four) times daily. Patient not taking: Reported on 02/11/2018 02/10/18   Bethel Born, PA-C  cetirizine (ZYRTEC) 10 MG tablet Take 1 tablet (10 mg total) by mouth daily. 02/11/18   Julieanne Manson, MD  FLUoxetine (PROZAC) 20 MG tablet Take 1 tablet (20 mg total) by mouth daily. Patient taking differently: Take 20 mg by mouth. 3 by mouth daily 07/27/17   Julieanne Manson, MD  HYDROcodone-acetaminophen (NORCO/VICODIN) 5-325 MG tablet Take 1 tablet by mouth every 6 (six) hours as needed for severe pain. 02/11/20   Antony Madura, PA-C  lidocaine (LIDODERM) 5 % Place 1 patch onto the skin daily. Remove & Discard patch within 12 hours or as directed by MD 02/11/20   Antony Madura, PA-C  methocarbamol (ROBAXIN) 500 MG tablet Take 1 tablet (500  mg total) by mouth every 12 (twelve) hours as needed for muscle spasms. 02/11/20   Antony Madura, PA-C  pregabalin (LYRICA) 100 MG capsule 2 caps by mouth 3 times daily 01/17/18   Julieanne Manson, MD    Allergies    Naproxen, Prednisone, and Tramadol  Review of Systems   Review of Systems  Ten systems reviewed and are negative for acute change, except as noted in the HPI.    Physical Exam Updated Vital Signs BP 140/83 (BP Location: Right Arm)   Pulse 67   Temp 98 F (36.7 C) (Oral)   Resp 16   Ht 5\' 10"  (1.778 m)   Wt 99.8 kg   SpO2 96%   BMI 31.57 kg/m   Physical Exam Vitals and nursing note reviewed.  Constitutional:      General: She is not in acute distress.    Appearance: She is well-developed. She is not diaphoretic.     Comments: Nontoxic appearing and in NAD  HENT:     Head: Normocephalic and atraumatic.  Eyes:     General: No scleral icterus.    Conjunctiva/sclera: Conjunctivae normal.  Cardiovascular:     Rate and Rhythm: Normal rate and regular rhythm.     Pulses: Normal pulses.  Pulmonary:     Effort: Pulmonary effort is normal. No respiratory distress.     Comments: Lungs CTAB. Chest expansion symmetric. Chest:     Chest wall: Tenderness (under R breast) present.  Musculoskeletal:        General: Normal range of motion.     Cervical back: Normal range of motion.  Skin:    General: Skin is warm and dry.     Coloration: Skin is not pale.     Findings: No erythema or rash.  Neurological:     Mental Status: She is alert and oriented to person, place, and time.  Psychiatric:        Behavior: Behavior normal.     ED Results / Procedures / Treatments   Labs (all labs ordered are listed, but only abnormal results are displayed) Labs Reviewed - No data to display  EKG None  Radiology DG Ribs Unilateral W/Chest Right  Result Date: 02/10/2020 CLINICAL DATA:  Right rib pain EXAM: RIGHT RIBS AND CHEST - 3+ VIEW COMPARISON:  Radiograph 02/10/2018  FINDINGS: Radiopaque BB marker positioned near the lateral right ninth interspace. Callus formation is noted at the lateral right seventh rib which does correlate well with the  onset of symptoms 1 month prior. No other visible rib fracture or acute osseous abnormality is seen. Some streaky atelectatic changes are noted in the lung bases, possibly related to splinting. The cardiomediastinal contours are unremarkable. Mild degenerative changes in the shoulders and spine with prior cervical fusion. IMPRESSION: 1. Callus formation at the lateral right seventh rib which does correlate well with the onset of symptoms 1 month prior. No other visible rib fracture or acute osseous abnormality is seen. 2. Streaky atelectatic changes in the lung bases may reflect splinting. Electronically Signed   By: Lovena Le M.D.   On: 02/10/2020 21:48    Procedures Procedures (including critical care time)  Medications Ordered in ED Medications  lidocaine (LIDODERM) 5 % 1 patch (has no administration in time range)  HYDROcodone-acetaminophen (NORCO/VICODIN) 5-325 MG per tablet 1 tablet (1 tablet Oral Given 02/11/20 0050)  methocarbamol (ROBAXIN) tablet 500 mg (500 mg Oral Given 02/11/20 0050)    ED Course  I have reviewed the triage vital signs and the nursing notes.  Pertinent labs & imaging results that were available during my care of the patient were reviewed by me and considered in my medical decision making (see chart for details).    MDM Rules/Calculators/A&P                      54 year old female presents to the emergency department for evaluation of right-sided chest wall pain.  Chest x-ray shows callus formation at the lateral right seventh rib.  This does correlate with the area of patient's symptoms.  She has no history of trauma.  Was also evaluated for right-sided chest wall pain in April 2019 with a negative chest x-ray.  It is unclear whether this callus formation may be more subacute versus chronic  as chest x-ray from 2 years ago may have missed an occult fracture.  Still, lung sounds are clear and she is afebrile without hypoxia.  No signs of respiratory distress.  Irregardless of timing of injury, treatment remains supportive.  Will discharge with a short course of pain medication as well as Lidoderm patches and muscle relaxers.  Advised alternation of ice and heat as well as primary care follow-up.  Return precautions discussed and provided. Patient discharged in stable condition with no unaddressed concerns.   Final Clinical Impression(s) / ED Diagnoses Final diagnoses:  Right-sided chest wall pain  History of rib fracture    Rx / DC Orders ED Discharge Orders         Ordered    methocarbamol (ROBAXIN) 500 MG tablet  Every 12 hours PRN     02/11/20 0037    HYDROcodone-acetaminophen (NORCO/VICODIN) 5-325 MG tablet  Every 6 hours PRN     02/11/20 0037    lidocaine (LIDODERM) 5 %  Every 24 hours     02/11/20 0037           Antonietta Breach, PA-C 02/11/20 0059    Molpus, Jenny Reichmann, MD 02/11/20 8527

## 2020-04-13 ENCOUNTER — Other Ambulatory Visit: Payer: Self-pay

## 2020-04-13 ENCOUNTER — Emergency Department (HOSPITAL_COMMUNITY)
Admission: EM | Admit: 2020-04-13 | Discharge: 2020-04-13 | Disposition: A | Discharge status: Home / Self Care | Payer: Medicaid Other | Attending: Emergency Medicine | Admitting: Emergency Medicine

## 2020-04-13 ENCOUNTER — Emergency Department (HOSPITAL_COMMUNITY): Payer: Medicaid Other

## 2020-04-13 ENCOUNTER — Encounter (HOSPITAL_COMMUNITY): Payer: Self-pay | Admitting: Emergency Medicine

## 2020-04-13 DIAGNOSIS — Y9389 Activity, other specified: Secondary | ICD-10-CM | POA: Diagnosis not present

## 2020-04-13 DIAGNOSIS — Y939 Activity, unspecified: Secondary | ICD-10-CM | POA: Insufficient documentation

## 2020-04-13 DIAGNOSIS — S20211A Contusion of right front wall of thorax, initial encounter: Secondary | ICD-10-CM | POA: Diagnosis not present

## 2020-04-13 DIAGNOSIS — Y9289 Other specified places as the place of occurrence of the external cause: Secondary | ICD-10-CM | POA: Diagnosis not present

## 2020-04-13 DIAGNOSIS — F17210 Nicotine dependence, cigarettes, uncomplicated: Secondary | ICD-10-CM | POA: Diagnosis not present

## 2020-04-13 DIAGNOSIS — Y998 Other external cause status: Secondary | ICD-10-CM | POA: Insufficient documentation

## 2020-04-13 DIAGNOSIS — R0781 Pleurodynia: Secondary | ICD-10-CM | POA: Diagnosis present

## 2020-04-13 DIAGNOSIS — W010XXA Fall on same level from slipping, tripping and stumbling without subsequent striking against object, initial encounter: Secondary | ICD-10-CM | POA: Insufficient documentation

## 2020-04-13 MED ORDER — OXYCODONE-ACETAMINOPHEN 5-325 MG PO TABS
1.0000 | ORAL_TABLET | Freq: Once | ORAL | Status: AC
  Administered 2020-04-13: 1 via ORAL
  Filled 2020-04-13: qty 1

## 2020-04-13 MED ORDER — OXYCODONE-ACETAMINOPHEN 5-325 MG PO TABS: 1.0000 | ORAL_TABLET | Freq: Three times a day (TID) | ORAL | 0 refills | Status: DC | PRN

## 2020-04-13 NOTE — Discharge Instructions (Addendum)
Take the pain medication as needed. Use incentive spirometry to help with symptoms as well. Follow-up with your primary care provider. Return to the ER for worsening pain, chest pain, shortness of breath, leg swelling.

## 2020-04-13 NOTE — ED Triage Notes (Signed)
Per pt, states she was having a coughing fit last night and thinks she cracked a rib-increased pain on right side

## 2020-04-13 NOTE — ED Provider Notes (Signed)
[facility]-EMERGENCY DEPT Provider Note   CSN: [id number] Arrival date & time: 04/13/20  3013     History Chief Complaint  Patient presents with  . rib cage pain    Crystal Baker is a 54 y.o. female with a past medical history of arthritis, PTSD presenting to the ED with a chief complaint of right lower rib pain.  States that this morning she started having gradual onset of pain in her right rib area.  States that she had a coughing fit last night and is concerned that she may have broken ribs.  She reports history of broken ribs in the same area in the past.  She took Tylenol with only minimal improvement in her symptoms.  She denies productive cough, other central chest pain, shortness of breath, fever or fall.  HPI     Past Medical History:  Diagnosis Date  . Anxiety    in the past  . Anxiety and depression   . Arthritis   . Chronic bilateral low back pain 07/10/2017  . Depression    in the past.  Depression and anxiety starting in childhood. No counseling until 2015   . PTSD (post-traumatic stress disorder) 07/10/2017  . PTSD (post-traumatic stress disorder)   . Syncope     Patient Active Problem List   Diagnosis Date Noted  . Chronic bilateral low back pain 07/10/2017  . PTSD (post-traumatic stress disorder) 07/10/2017  . Depression   . Chest pain 03/29/2016  . Syncope 03/29/2016  . Tobacco use disorder 03/29/2016  . Anxiety and depression 03/29/2016  . Interscapular pain 03/29/2016    Past Surgical History:  Procedure Laterality Date  . BACK SURGERY    . NECK SURGERY       OB History    Gravida  2   Para      Term      Preterm      AB      Living  2     SAB      TAB      Ectopic      Multiple      Live Births  2           Family History  Problem Relation Age of Onset  . Cancer Sister        Probably oral cancer    Social History   Tobacco Use  . Smoking status: Current Every Day Smoker    Packs/day:  0.50    Years: 35.00    Pack years: 17.50    Types: Cigarettes  . Smokeless tobacco: Never Used  Vaping Use  . Vaping Use: Never used  Substance Use Topics  . Alcohol use: No    Alcohol/week: 0.0 standard drinks  . Drug use: Yes    Types: Marijuana    Comment: Tried for pain in 2018, but made it worse, so stopped.    Home Medications Prior to Admission medications   Medication Sig Start Date End Date Taking? Authorizing Provider  acetaminophen (TYLENOL) 500 MG tablet Take 500 mg by mouth every 6 (six) hours as needed for mild pain or headache.   Yes [provider]  albuterol (PROVENTIL HFA;VENTOLIN HFA) 108 (90 Base) MCG/ACT inhaler Inhale 2 puffs into the lungs every 6 (six) hours as needed for wheezing or shortness of breath. Patient not taking: Reported on 04/13/2020 02/11/18   Julieanne Manson, MD  ARIPiprazole (ABILIFY) 15 MG tablet Take 1 tablet (15 mg total) by mouth  daily. Patient not taking: Reported on 04/13/2020 07/27/17   Mack Hook, MD  azithromycin (ZITHROMAX) 250 MG tablet 2 tabs by mouth today, then 1 tab by mouth daily for 4 more days Patient not taking: Reported on 04/13/2020 02/11/18   Mack Hook, MD  cephALEXin (KEFLEX) 500 MG capsule Take 1 capsule (500 mg total) by mouth 4 (four) times daily. Patient not taking: Reported on 02/11/2018 02/10/18   Recardo Evangelist, PA-C  cetirizine (ZYRTEC) 10 MG tablet Take 1 tablet (10 mg total) by mouth daily. Patient not taking: Reported on 04/13/2020 02/11/18   Mack Hook, MD  FLUoxetine (PROZAC) 20 MG tablet Take 1 tablet (20 mg total) by mouth daily. Patient not taking: Reported on 04/13/2020 07/27/17   Mack Hook, MD  HYDROcodone-acetaminophen (NORCO/VICODIN) 5-325 MG tablet Take 1 tablet by mouth every 6 (six) hours as needed for severe pain. Patient not taking: Reported on 04/13/2020 02/11/20   Antonietta Breach, PA-C  lidocaine (LIDODERM) 5 % Place 1 patch onto the skin daily. Remove &  Discard patch within 12 hours or as directed by MD Patient not taking: Reported on 04/13/2020 02/11/20   Antonietta Breach, PA-C  methocarbamol (ROBAXIN) 500 MG tablet Take 1 tablet (500 mg total) by mouth every 12 (twelve) hours as needed for muscle spasms. Patient not taking: Reported on 04/13/2020 02/11/20   Antonietta Breach, PA-C  oxyCODONE-acetaminophen (PERCOCET/ROXICET) 5-325 MG tablet Take 1 tablet by mouth every 8 (eight) hours as needed for severe pain. 04/13/20   Ostin Mathey, PA-C  pregabalin (LYRICA) 100 MG capsule 2 caps by mouth 3 times daily Patient not taking: Reported on 04/13/2020 01/17/18   Mack Hook, MD    Allergies    Naproxen, Prednisone, and Tramadol  Review of Systems   Review of Systems  Constitutional: Negative for chills and fever.  Respiratory: Positive for cough. Negative for shortness of breath.   Cardiovascular: Negative for chest pain.  Musculoskeletal: Positive for arthralgias and myalgias.  Skin: Negative for wound.  Neurological: Negative for weakness and numbness.    Physical Exam Updated Vital Signs BP 119/80   Pulse 67   Temp 97.9 F (36.6 C) (Oral)   Resp 16   SpO2 93%   Physical Exam Vitals and nursing note reviewed.  Constitutional:      General: She is not in acute distress.    Appearance: She is well-developed. She is not diaphoretic.  HENT:     Head: Normocephalic and atraumatic.  Eyes:     General: No scleral icterus.    Conjunctiva/sclera: Conjunctivae normal.  Cardiovascular:     Rate and Rhythm: Normal rate and regular rhythm.     Heart sounds: Normal heart sounds.  Pulmonary:     Effort: Pulmonary effort is normal. No respiratory distress.     Breath sounds: Normal breath sounds.  Chest:     Chest wall: Tenderness present.       Comments: Tenderness to palpation of the right lower rib area without overlying skin changes.  Speaking complete sentences without difficulty, no signs of respiratory distress. Musculoskeletal:      Cervical back: Normal range of motion.  Skin:    Findings: No rash.  Neurological:     Mental Status: She is alert.     ED Results / Procedures / Treatments   Labs (all labs ordered are listed, but only abnormal results are displayed) Labs Reviewed - No data to display  EKG None  Radiology DG Ribs Unilateral W/Chest Right  Result  Date: 04/13/2020 CLINICAL DATA:  Right rib pain EXAM: RIGHT RIBS AND CHEST - 3+ VIEW COMPARISON:  02/10/2020 FINDINGS: Callus formation is noted at the lateral aspect of the right seventh and eighth ribs compatible with healing nondisplaced fractures. The seventh rib fracture was seen on the previous study 02/10/2020. Eighth rib fracture was not definitively seen on the prior. No displaced rib fracture identified. There is no evidence of pneumothorax or pleural effusion. Both lungs are clear. Heart size and mediastinal contours are within normal limits. IMPRESSION: 1. Healing nondisplaced fractures of the right seventh and eighth ribs. 2. No acute displaced rib fracture identified.  No pneumothorax. Electronically Signed   By: Duanne Guess D.O.   On: 04/13/2020 09:25    Procedures Procedures (including critical care time)  Medications Ordered in ED Medications  oxyCODONE-acetaminophen (PERCOCET/ROXICET) 5-325 MG per tablet 1 tablet (1 tablet Oral Given 04/13/20 1101)    ED Course  I have reviewed the triage vital signs and the nursing notes.  Pertinent labs & imaging results that were available during my care of the patient were reviewed by me and considered in my medical decision making (see chart for details).    MDM Rules/Calculators/A&P                          54 year old female presenting to the ED with a chief complaint of right rib pain.  Reports history of rib fractures in the past and is concerned for the same after having a coughing fit last night.  Denies productive cough, other chest pain or shortness of breath.  Denies any fever.   Tenderness palpation of the right lower rib area without overlying skin changes.  X-ray shows healing prior fractures without any acute fractures.  Suspect that symptoms are due to contusion.  No evidence of pneumothorax.  Will treat with a short course of pain medication presented spirometry.  Patient encouraged to follow-up with PCP and return for worsening symptoms.  All imaging, if done today, including plain films, CT scans, and ultrasounds, independently reviewed by me, and interpretations confirmed via formal radiology reads.  Patient is hemodynamically stable, in NAD, and able to ambulate in the ED. Evaluation does not show pathology that would require ongoing emergent intervention or inpatient treatment. I explained the diagnosis to the patient. Pain has been managed and has no complaints prior to discharge. Patient is comfortable with above plan and is stable for discharge at this time. All questions were answered prior to disposition. Strict return precautions for returning to the ED were discussed. Encouraged follow up with PCP.   Prior to providing a prescription for a controlled substance, I independently reviewed the patient's recent prescription history on the [place] Controlled Substance Reporting System. The patient had no recent or regular prescriptions and was deemed appropriate for a brief, less than 3 day prescription of narcotic for acute analgesia.  An After Visit Summary was printed and given to the patient.   Portions of this note were generated with Scientist, clinical (histocompatibility and immunogenetics). Dictation errors may occur despite best attempts at proofreading.  Final Clinical Impression(s) / ED Diagnoses Final diagnoses:  Contusion of rib on right side, initial encounter    Rx / DC Orders ED Discharge Orders         Ordered    oxyCODONE-acetaminophen (PERCOCET/ROXICET) 5-325 MG tablet  Every 8 hours PRN     Discontinue  Reprint     04/13/20 1114  Dietrich Pates,  PA-C 04/13/20 1115    Lorre Nick, MD 04/15/20 [phone]

## 2020-10-01 ENCOUNTER — Other Ambulatory Visit: Payer: Self-pay | Admitting: Urgent Care

## 2020-10-01 DIAGNOSIS — Z1231 Encounter for screening mammogram for malignant neoplasm of breast: Secondary | ICD-10-CM

## 2020-10-05 ENCOUNTER — Ambulatory Visit
Admission: RE | Admit: 2020-10-05 | Discharge: 2020-10-05 | Disposition: A | Discharge status: Home / Self Care | Payer: Medicaid Other | Source: Ambulatory Visit | Attending: Urgent Care | Admitting: Urgent Care

## 2020-10-05 ENCOUNTER — Other Ambulatory Visit: Payer: Self-pay

## 2020-10-05 DIAGNOSIS — Z1231 Encounter for screening mammogram for malignant neoplasm of breast: Secondary | ICD-10-CM

## 2021-04-13 IMAGING — CR DG RIBS W/ CHEST 3+V*R*
4 series · 4 of 4 positions shown · non-contrast
Comparison: 02/10/2020

CLINICAL DATA: Right rib pain

EXAM:
RIGHT RIBS AND CHEST - 3+ VIEW

[w chest pa]
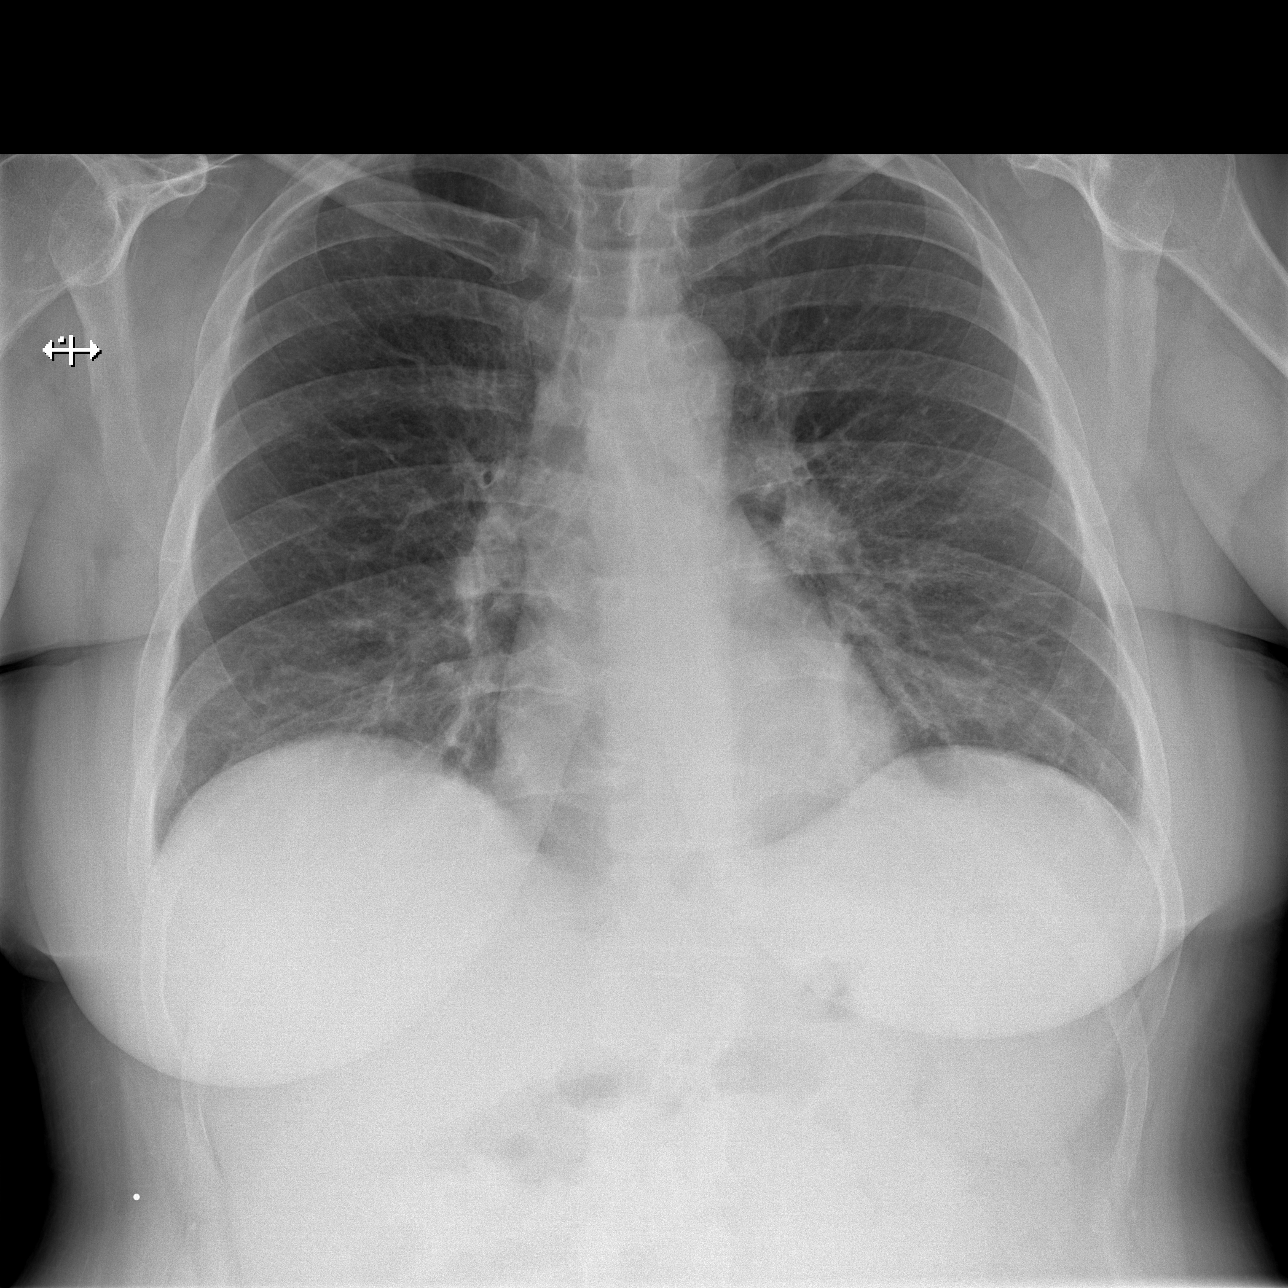

[w ribs obl right (1 of 3)]
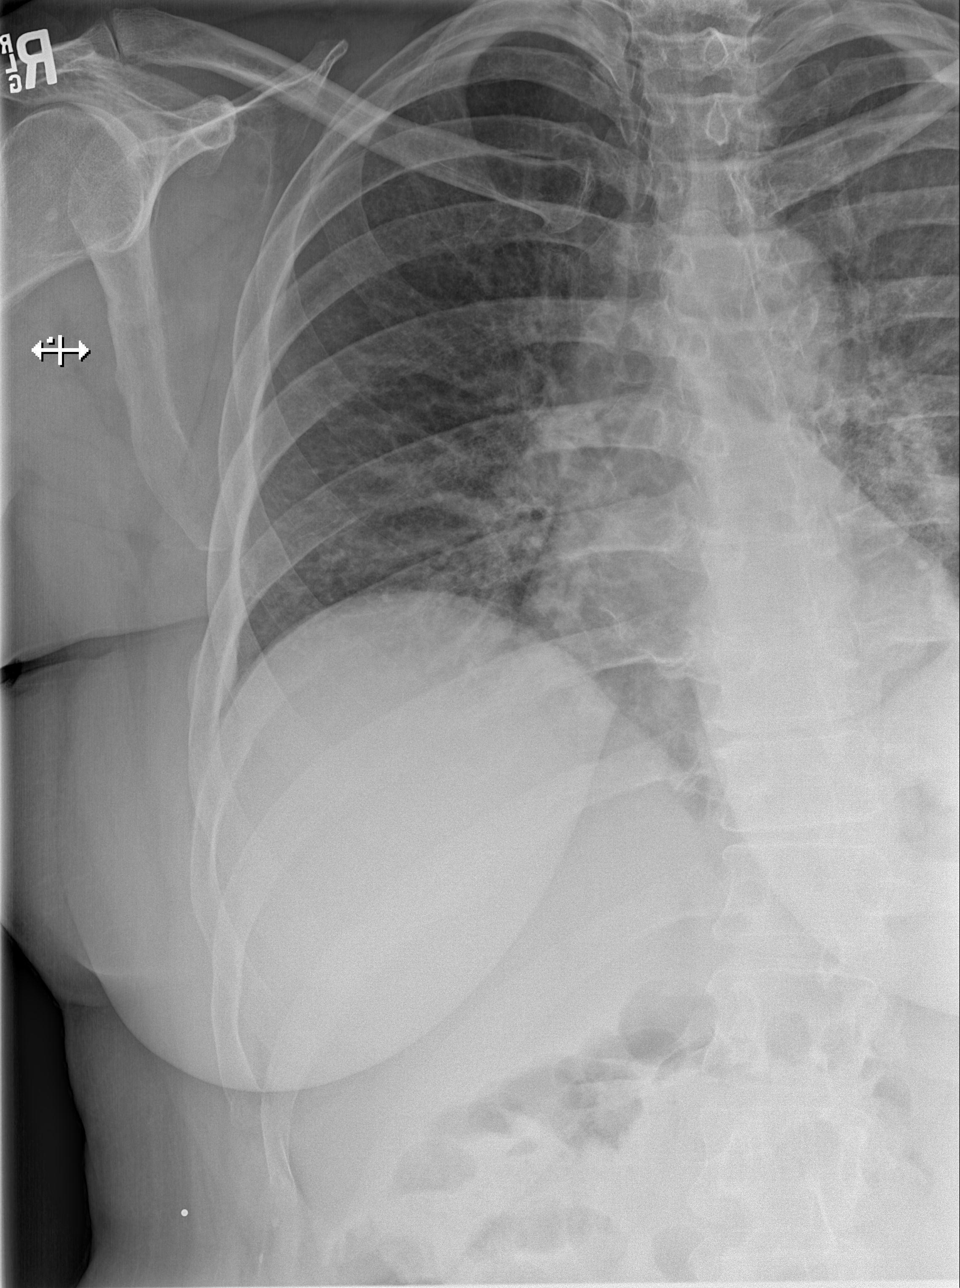

[w ribs obl right (2 of 3)]
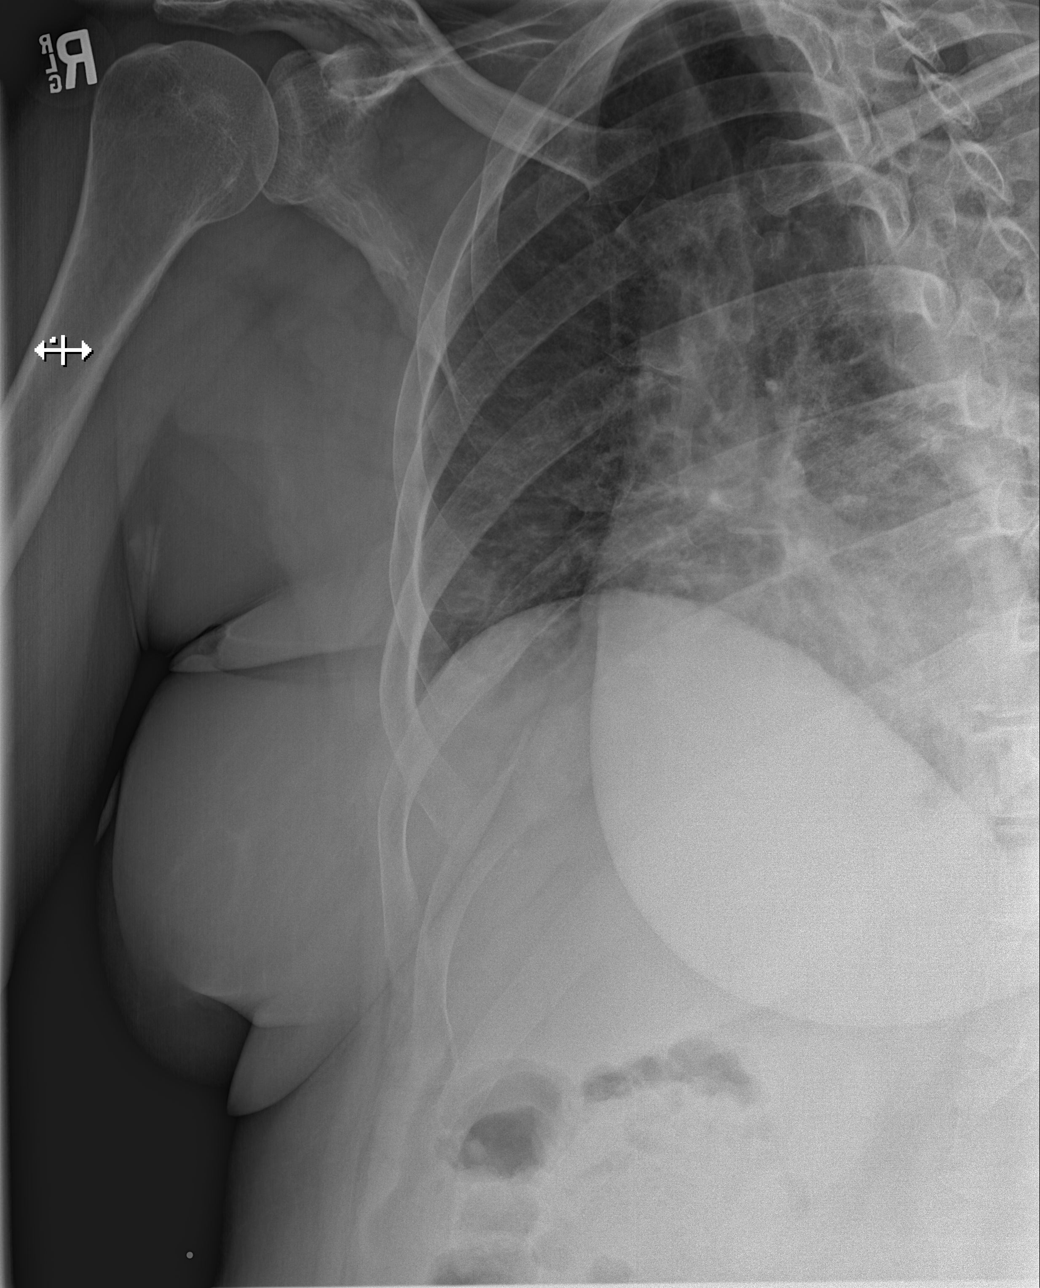

[w ribs obl right (3 of 3)]
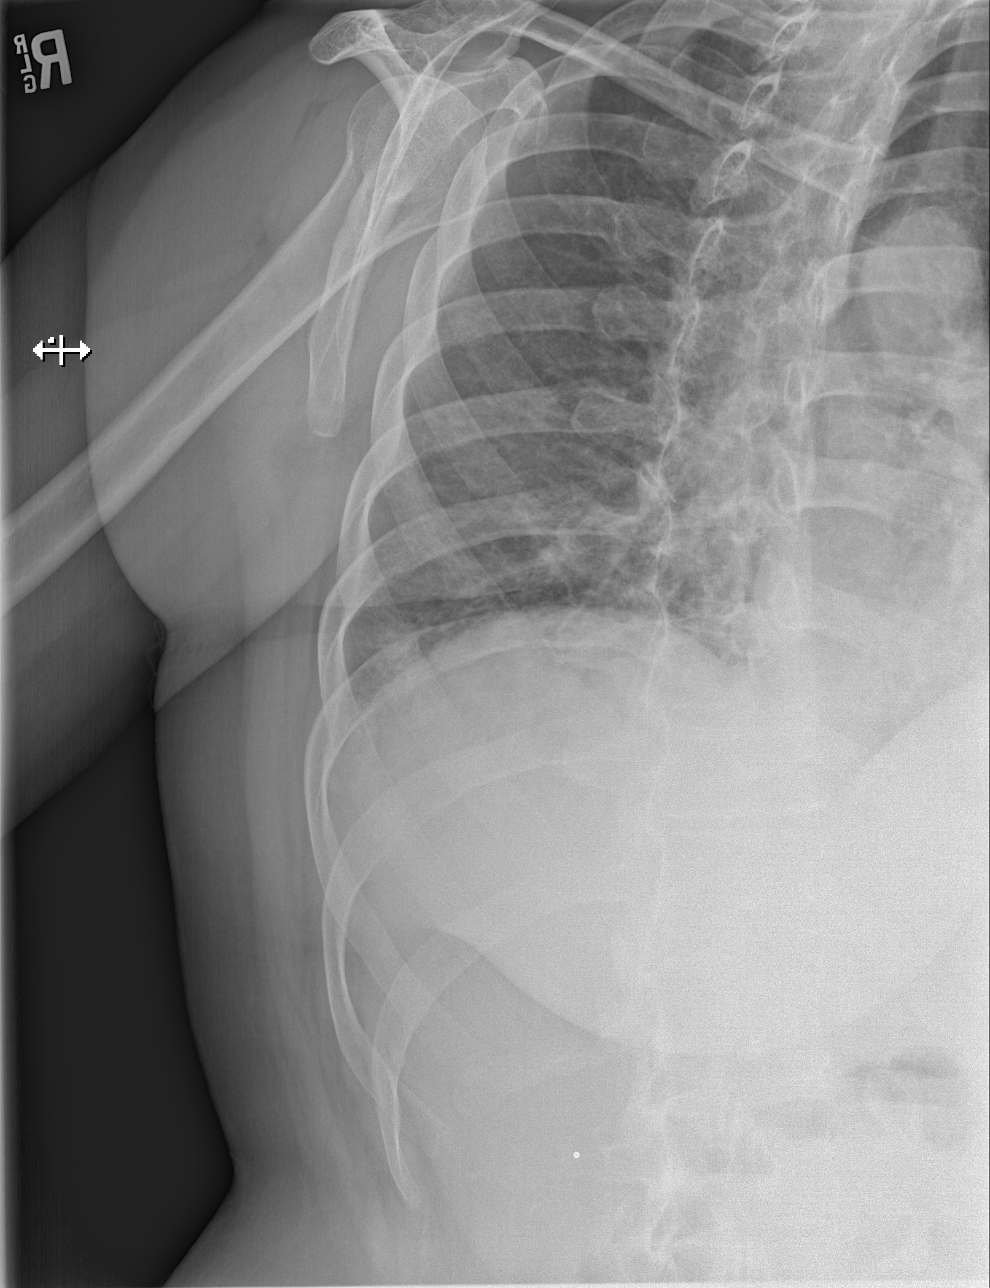

[4 of 4 positions shown; findings below may reference images not displayed]

FINDINGS: Callus formation is noted at the lateral aspect of the right seventh
and eighth ribs compatible with healing nondisplaced fractures. The
seventh rib fracture was seen on the previous study 02/10/2020.
Eighth rib fracture was not definitively seen on the prior. No
displaced rib fracture identified. There is no evidence of
pneumothorax or pleural effusion. Both lungs are clear. Heart size
and mediastinal contours are within normal limits.
IMPRESSION: 1. Healing nondisplaced fractures of the right seventh and eighth
ribs.
2. No acute displaced rib fracture identified.  No pneumothorax.

## 2021-12-02 ENCOUNTER — Encounter (HOSPITAL_COMMUNITY): Payer: Self-pay

## 2021-12-02 ENCOUNTER — Emergency Department (HOSPITAL_COMMUNITY)
Admission: EM | Admit: 2021-12-02 | Discharge: 2021-12-02 | Disposition: A | Discharge status: Home / Self Care | Payer: Medicaid Other | Attending: Emergency Medicine | Admitting: Emergency Medicine

## 2021-12-02 ENCOUNTER — Other Ambulatory Visit: Payer: Self-pay

## 2021-12-02 ENCOUNTER — Emergency Department (HOSPITAL_COMMUNITY): Payer: Medicaid Other

## 2021-12-02 DIAGNOSIS — U071 COVID-19: Secondary | ICD-10-CM | POA: Diagnosis not present

## 2021-12-02 DIAGNOSIS — F17200 Nicotine dependence, unspecified, uncomplicated: Secondary | ICD-10-CM | POA: Insufficient documentation

## 2021-12-02 DIAGNOSIS — Z79899 Other long term (current) drug therapy: Secondary | ICD-10-CM | POA: Insufficient documentation

## 2021-12-02 DIAGNOSIS — R0602 Shortness of breath: Secondary | ICD-10-CM | POA: Diagnosis present

## 2021-12-02 LAB — HCG, QUANTITATIVE, PREGNANCY: hCG, Beta Chain, Quant, S: 6 m[IU]/mL — ABNORMAL HIGH (ref <5)

## 2021-12-02 LAB — I-STAT BETA HCG BLOOD, ED (MC, WL, AP ONLY): I-stat hCG, quantitative: 5.9 m[IU]/mL — ABNORMAL HIGH (ref <5)

## 2021-12-02 LAB — CBC
HCT: 45.9 % (ref 36.0–46.0)
Hemoglobin: 15.1 g/dL — ABNORMAL HIGH (ref 12.0–15.0)
MCH: 32 pg (ref 26.0–34.0)
MCHC: 32.9 g/dL (ref 30.0–36.0)
MCV: 97.2 fL (ref 80.0–100.0)
Platelets: 196 10*3/uL (ref 150–400)
RBC: 4.72 MIL/uL (ref 3.87–5.11)
RDW: 12.3 % (ref 11.5–15.5)
WBC: 7.7 10*3/uL (ref 4.0–10.5)
nRBC: 0 % (ref 0.0–0.2)

## 2021-12-02 LAB — TROPONIN I (HIGH SENSITIVITY): Troponin I (High Sensitivity): <2 ng/L (ref <18)

## 2021-12-02 LAB — BASIC METABOLIC PANEL
Anion gap: 9 (ref 5–15)
BUN: 9 mg/dL (ref 6–20)
CO2: 25 mmol/L (ref 22–32)
Calcium: 9.1 mg/dL (ref 8.9–10.3)
Chloride: 101 mmol/L (ref 98–111)
Creatinine, Ser: 0.79 mg/dL (ref 0.44–1.00)
GFR, Estimated: >60 mL/min (ref >60)
Glucose, Bld: 94 mg/dL (ref 70–99)
Potassium: 4.2 mmol/L (ref 3.5–5.1)
Sodium: 135 mmol/L (ref 135–145)

## 2021-12-02 MED ORDER — ALBUTEROL SULFATE HFA 108 (90 BASE) MCG/ACT IN AERS: 4.0000 | INHALATION_SPRAY | Freq: Once | RESPIRATORY_TRACT | Status: DC

## 2021-12-02 MED ORDER — ALBUTEROL SULFATE HFA 108 (90 BASE) MCG/ACT IN AERS
2.0000 | INHALATION_SPRAY | RESPIRATORY_TRACT | Status: DC | PRN
  Administered 2021-12-02: 2 via RESPIRATORY_TRACT
  Filled 2021-12-02: qty 6.7

## 2021-12-02 NOTE — ED Triage Notes (Signed)
Patient went to her PCP today and sent to the ED for c/o SOB, chest tightness, and patient tested positive for Covid.

## 2021-12-02 NOTE — ED Provider Notes (Signed)
[facility] DEPT Provider Note   CSN: [id number] Arrival date & time: 12/02/21  1054     History  Chief Complaint  Patient presents with   Covid Positive   Shortness of Breath    Ase Wiitala is a 56 y.o. female.  Patient is a 56 year old female who presents with chest pain and shortness of breath.  She said that she has had some intermittent shortness of breath for the last several months although she had worsening symptoms over the last week.  Over the last week she has had some chest pain.  She describes as a tightness across her chest associated with some shortness of breath and wheezing.  She also has a sharper pain in her chest which is worse with coughing and deep breathing.  No leg pain or swelling.  No fevers.  She has had some productive cough and rhinorrhea.  No known fevers.  No vomiting.  She went to her PCPs office today and had a COVID test that was positive.  Her symptoms have been going on longer than 5 days.  She was sent here for further evaluation given her chest pain and shortness of breath.  No known history of heart problems.  She is an everyday smoker.  No known prior lung issues.  She was given an albuterol treatment in triage and is feeling a bit better after that.      Home Medications Prior to Admission medications   Medication Sig Start Date End Date Taking? Authorizing Provider  acetaminophen (TYLENOL) 500 MG tablet Take 500 mg by mouth every 6 (six) hours as needed for mild pain or headache.    [provider]  albuterol (PROVENTIL HFA;VENTOLIN HFA) 108 (90 Base) MCG/ACT inhaler Inhale 2 puffs into the lungs every 6 (six) hours as needed for wheezing or shortness of breath. Patient not taking: Reported on 04/13/2020 02/11/18   Mack Hook, MD  ARIPiprazole (ABILIFY) 15 MG tablet Take 1 tablet (15 mg total) by mouth daily. Patient not taking: Reported on 04/13/2020 07/27/17   Mack Hook, MD   azithromycin (ZITHROMAX) 250 MG tablet 2 tabs by mouth today, then 1 tab by mouth daily for 4 more days Patient not taking: Reported on 04/13/2020 02/11/18   Mack Hook, MD  cephALEXin (KEFLEX) 500 MG capsule Take 1 capsule (500 mg total) by mouth 4 (four) times daily. Patient not taking: Reported on 02/11/2018 02/10/18   Recardo Evangelist, PA-C  cetirizine (ZYRTEC) 10 MG tablet Take 1 tablet (10 mg total) by mouth daily. Patient not taking: Reported on 04/13/2020 02/11/18   Mack Hook, MD  FLUoxetine (PROZAC) 20 MG tablet Take 1 tablet (20 mg total) by mouth daily. Patient not taking: Reported on 04/13/2020 07/27/17   Mack Hook, MD  HYDROcodone-acetaminophen (NORCO/VICODIN) 5-325 MG tablet Take 1 tablet by mouth every 6 (six) hours as needed for severe pain. Patient not taking: Reported on 04/13/2020 02/11/20   Antonietta Breach, PA-C  lidocaine (LIDODERM) 5 % Place 1 patch onto the skin daily. Remove & Discard patch within 12 hours or as directed by MD Patient not taking: Reported on 04/13/2020 02/11/20   Antonietta Breach, PA-C  methocarbamol (ROBAXIN) 500 MG tablet Take 1 tablet (500 mg total) by mouth every 12 (twelve) hours as needed for muscle spasms. Patient not taking: Reported on 04/13/2020 02/11/20   Antonietta Breach, PA-C  oxyCODONE-acetaminophen (PERCOCET/ROXICET) 5-325 MG tablet Take 1 tablet by mouth every 8 (eight) hours as needed for severe pain.  04/13/20   Khatri, Hina, PA-C  pregabalin (LYRICA) 100 MG capsule 2 caps by mouth 3 times daily Patient not taking: Reported on 04/13/2020 01/17/18   Mack Hook, MD      Allergies    Naproxen, Prednisone, and Tramadol    Review of Systems   Review of Systems  Constitutional:  Positive for fatigue. Negative for chills, diaphoresis and fever.  HENT:  Negative for congestion, rhinorrhea and sneezing.   Eyes: Negative.   Respiratory:  Positive for cough, shortness of breath and wheezing. Negative for chest tightness.    Cardiovascular:  Positive for chest pain. Negative for leg swelling.  Gastrointestinal:  Negative for abdominal pain, blood in stool, diarrhea, nausea and vomiting.  Genitourinary:  Negative for difficulty urinating, flank pain, frequency and hematuria.  Musculoskeletal:  Negative for arthralgias and back pain.  Skin:  Negative for rash.  Neurological:  Negative for dizziness, speech difficulty, weakness, numbness and headaches.   Physical Exam Updated Vital Signs BP 121/72    Pulse 64    Temp 98.1 F (36.7 C) (Oral)    Resp 13    Ht 5\' 10"  (1.778 m)    Wt 106.1 kg    SpO2 91%    BMI 33.58 kg/m  Physical Exam Constitutional:      Appearance: She is well-developed.  HENT:     Head: Normocephalic and atraumatic.  Eyes:     Pupils: Pupils are equal, round, and reactive to light.  Cardiovascular:     Rate and Rhythm: Normal rate and regular rhythm.     Heart sounds: Normal heart sounds.  Pulmonary:     Effort: Pulmonary effort is normal. No respiratory distress.     Breath sounds: Wheezing present. No rales.  Chest:     Chest wall: No tenderness.  Abdominal:     General: Bowel sounds are normal.     Palpations: Abdomen is soft.     Tenderness: There is no abdominal tenderness. There is no guarding or rebound.  Musculoskeletal:        General: Normal range of motion.     Cervical back: Normal range of motion and neck supple.     Comments: No edema or calf tenderness  Lymphadenopathy:     Cervical: No cervical adenopathy.  Skin:    General: Skin is warm and dry.     Findings: No rash.  Neurological:     Mental Status: She is alert and oriented to person, place, and time.    ED Results / Procedures / Treatments   Labs (all labs ordered are listed, but only abnormal results are displayed) Labs Reviewed  CBC - Abnormal; Notable for the following components:      Result Value   Hemoglobin 15.1 (*)    All other components within normal limits  HCG, QUANTITATIVE, PREGNANCY -  Abnormal; Notable for the following components:   hCG, Beta Chain, Quant, S 6 (*)    All other components within normal limits  I-STAT BETA HCG BLOOD, ED ([facility], [facility], [facility] ONLY) - Abnormal; Notable for the following components:   I-stat hCG, quantitative 5.9 (*)    All other components within normal limits  BASIC METABOLIC PANEL  D-DIMER, QUANTITATIVE  TROPONIN I (HIGH SENSITIVITY)  TROPONIN I (HIGH SENSITIVITY)    EKG EKG Interpretation  Date/Time:  Friday December 02 2021 11:19:04 EST Ventricular Rate:  75 PR Interval:  140 QRS Duration: 99 QT Interval:  408 QTC Calculation: 456 R Axis:   -  63 Text Interpretation: Sinus rhythm Probable left atrial enlargement Left anterior fascicular block RSR' in V1 or V2, right VCD or RVH No significant change since prior 4/19 Confirmed by Aletta Edouard [phone]) on 12/02/2021 11:26:14 AM  Radiology DG Chest 2 View  Result Date: 12/02/2021 CLINICAL DATA:  Shortness of breath, chest tightness EXAM: CHEST - 2 VIEW COMPARISON:  Chest radiograph 04/13/2020 FINDINGS: The cardiomediastinal silhouette is normal. There is no focal consolidation or pulmonary edema. There is no pleural effusion or pneumothorax. There is no acute osseous abnormality. IMPRESSION: No radiographic evidence of acute cardiopulmonary process. Electronically Signed   By: Valetta Mole M.D.   On: 12/02/2021 11:36    Procedures Procedures    Medications Ordered in ED Medications  albuterol (VENTOLIN HFA) 108 (90 Base) MCG/ACT inhaler 2 puff (2 puffs Inhalation Given 12/02/21 1229)  albuterol (VENTOLIN HFA) 108 (90 Base) MCG/ACT inhaler 4 puff (has no administration in time range)    ED Course/ Medical Decision Making/ A&P                           Medical Decision Making Problems Addressed: COVID-19 virus infection: acute illness or injury Shortness of breath: acute illness or injury  Amount and/or Complexity of Data Reviewed External Data Reviewed: notes. Labs: ordered.  Decision-making details documented in ED Course. Radiology: ordered and independent interpretation performed. Decision-making details documented in ED Course. ECG/medicine tests: ordered and independent interpretation performed. Decision-making details documented in ED Course.  Risk Prescription drug management.   Patient is a 56 year old female who presents with shortness of breath and chest pain.  Her EKG was reviewed by me and shows no ischemic changes.  She has had 1 negative troponin.  Her chest x-ray was done and shows no evidence of pneumonia.  This was reviewed by me.  On my exam, her oxygen saturation is 93-94 on room air at rest.  She does have some wheezing on exam.  I ordered another albuterol treatment.  She reportedly had a positive COVID test at her PCPs office today.  She is out of the window for Paxlovid.  she also is awaiting repeat troponin and D-dimer.  Her other labs reviewed and are nonconcerning.  Patient abruptly told the staff that she had a family emergency and needed to leave.  She would not stay for any further treatment.  She left AMA prior to completion of treatment.  She does have an albuterol inhaler to use at home.  Was advised to f/u with her PCP.  Final Clinical Impression(s) / ED Diagnoses Final diagnoses:  Shortness of breath  COVID-19 virus infection    Rx / DC Orders ED Discharge Orders     None         Malvin Johns, MD 12/02/21 [phone]

## 2021-12-02 NOTE — ED Provider Triage Note (Cosign Needed)
Emergency Medicine Provider Triage Evaluation Note  Gail Vendetti , a 56 y.o. female  was evaluated in triage.  Pt complains of SOB, chest tightness/pain for the past couple of weeks, constant, no improvement or worsening. Denies any nausea, vomiting, or abdominal pain. Denies any sore throat or nasal congestion. Reports rhinorrhea and dry cough. Positive for COVID at PCP office  Review of Systems  Positive: Chest pain/tightness, SOB, rhinorrhea, cough Negative: See above  Physical Exam  BP 125/79 (BP Location: Left Arm)    Pulse 72    Temp 98.1 F (36.7 C) (Oral)    Resp 16    Ht 5\' 10"  (1.778 m)    Wt 106.1 kg    SpO2 93%    BMI 33.58 kg/m  Gen:   Awake, no distress   Resp:  Normal effort, exp wheezing with rhonchi R>L MSK:   Moves extremities without difficulty  Other:    Medical Decision Making  Medically screening exam initiated at 11:58 AM.  Appropriate orders placed.  Gredmarie Delange was informed that the remainder of the evaluation will be completed by another provider, this initial triage assessment does not replace that evaluation, and the importance of remaining in the ED until their evaluation is complete.  Cardiac order set placed   Eyvonne Mechanic, Achille Rich 12/02/21 1201

## 2021-12-15 ENCOUNTER — Institutional Professional Consult (permissible substitution): Payer: Medicaid Other | Admitting: Pulmonary Disease

## 2021-12-22 ENCOUNTER — Other Ambulatory Visit: Payer: Self-pay

## 2021-12-22 ENCOUNTER — Encounter: Payer: Self-pay | Admitting: Internal Medicine

## 2021-12-22 ENCOUNTER — Ambulatory Visit (INDEPENDENT_AMBULATORY_CARE_PROVIDER_SITE_OTHER): Payer: Medicaid Other | Admitting: Internal Medicine

## 2021-12-22 VITALS — BP 118/70 | HR 83 | Temp 98.0°F | Ht 70.0 in | Wt 239.0 lb

## 2021-12-22 DIAGNOSIS — J449 Chronic obstructive pulmonary disease, unspecified: Secondary | ICD-10-CM | POA: Diagnosis not present

## 2021-12-22 DIAGNOSIS — F17210 Nicotine dependence, cigarettes, uncomplicated: Secondary | ICD-10-CM | POA: Diagnosis not present

## 2021-12-22 DIAGNOSIS — F17200 Nicotine dependence, unspecified, uncomplicated: Secondary | ICD-10-CM

## 2021-12-22 MED ORDER — UMECLIDINIUM-VILANTEROL 62.5-25 MCG/ACT IN AEPB: 1.0000 | INHALATION_SPRAY | Freq: Every day | RESPIRATORY_TRACT | 5 refills | Status: DC

## 2021-12-22 NOTE — Progress Notes (Signed)
The patient has been prescribed the inhaler anoro. Inhaler technique was demonstrated to patient. The patient subsequently demonstrated correct technique. ? ? ?

## 2021-12-22 NOTE — Patient Instructions (Signed)
Please schedule follow up scheduled with myself in 6 weeks.  If my schedule is not open yet, we will contact you with a reminder closer to that time. Please call [phone] if you haven't heard from [place] a month before.   Before your next visit I would like you to have: Full set of PFTs - 1 hour  Start taking anoro inhaler 1 puff once a day.    Take the albuterol rescue inhaler every 4 to 6 hours as needed for wheezing or shortness of breath. You can also take it 15 minutes before exercise or exertional activity. Side effects include heart racing or pounding, jitters or anxiety. If you have a history of an irregular heart rhythm, it can make this worse. Can also give some patients a hard time sleeping.  Understanding COPD   What is COPD? COPD stands for chronic obstructive pulmonary (lung) disease. COPD is a general term used for several lung diseases.  COPD is an umbrella term and encompasses other  common diseases in this group like chronic bronchitis and emphysema. Chronic asthma may also be included in this group. While some patients with COPD have only chronic bronchitis or emphysema, most patients have a combination of both.  You might hear these terms used in exchange for one another.   COPD adds to the work of the heart. Diseased lungs may reduce the amount of oxygen that goes to the blood. High blood pressure in blood vessels from the heart to the lungs makes it difficult for the heart to pump. Lung disease can also cause the body to produce too many red blood cells which may make the blood thicker and harder to pump.   Patients who have COPD with low oxygen levels may develop an enlarged heart (cor pulmonale). This condition weakens the heart and causes increased shortness of breath and swelling in the legs and feet.   Chronic bronchitis Chronic bronchitis is irritation and inflammation (swelling) of the lining in the bronchial tubes (air passages). The irritation causes coughing and  an excess amount of mucus in the airways. The swelling makes it difficult to get air in and out of the lungs. The small, hair-like structures on the inside of the airways (called cilia) may be damaged by the irritation. The cilia are then unable to help clean mucus from the airways.  Bronchitis is generally considered to be chronic when you have: a productive cough (cough up mucus) and shortness of breath that lasts about 3 months or more each year for 2 or more years in a row. Your doctor may define chronic bronchitis differently.   Emphysema Emphysema is the destruction, or breakdown, of the walls of the alveoli (air sacs) located at the end of the bronchial tubes. The damaged alveoli are not able to exchange oxygen and carbon dioxide between the lungs and the blood. The bronchioles lose their elasticity and collapse when you exhale, trapping air in the lungs. The trapped air keeps fresh air and oxygen from entering the lungs.   Who is affected by COPD? Emphysema and chronic bronchitis affect approximately 16 million people in the [place], or close to 11 percent of the population.   Symptoms of COPD  Shortness of breath  Shortness of breath with mild exercise (walking, using the stairs, etc.)  Chronic, productive cough (with mucus)  A feeling of "tightness" in the chest  Wheezing   What causes COPD? The two primary causes of COPD are cigarette smoking and alpha1-antitrypsin (AAT)  deficiency. Air pollution and occupational dusts may also contribute to COPD, especially when the person exposed to these substances is a cigarette smoker.  Cigarette smoke causes COPD by irritating the airways and creating inflammation that narrows the airways, making it more difficult to breathe. Cigarette smoke also causes the cilia to stop working properly so mucus and trapped particles are not cleaned from the airways. As a result, chronic cough and excess mucus production develop, leading to chronic  bronchitis.  In some people, chronic bronchitis and infections can lead to destruction of the small airways, or emphysema.  AAT deficiency, an inherited disorder, can also lead to emphysema. Alpha antitrypsin (AAT) is a protective material produced in the liver and transported to the lungs to help combat inflammation. When there is not enough of the chemical AAT, the body is no longer protected from an enzyme in the white blood cells.   How is COPD diagnosed?  To diagnose COPD, the physician needs to know: Do you smoke?  Have you had chronic exposure to dust or air pollutants?  Do other members of your family have lung disease?  Are you short of breath?  Do you get short of breath with exercise?  Do you have chronic cough and/or wheezing?  Do you cough up excess mucus?  To help with the diagnosis, the physician will conduct a thorough physical exam which includes:  Listening to your lungs and heart  Checking your blood pressure and pulse  Examining your nose and throat  Checking your feet and ankles for swelling   Laboratory and other tests Several laboratory and other tests are needed to confirm a diagnosis of COPD. These tests may include:  Chest X-ray to look for lung changes that could be caused by COPD   Spirometry and pulmonary function tests (PFTs) to determine lung volume and air flow  Pulse oximetry to measure the saturation of oxygen in the blood  Arterial blood gases (ABGs) to determine the amount of oxygen and carbon dioxide in the blood  Exercise testing to determine if the oxygen level in the blood drops during exercise   Treatment In the beginning stages of COPD, there is minimal shortness of breath that may be noticed only during exercise. As the disease progresses, shortness of breath may worsen and you may need to wear an oxygen device.   To help control other symptoms of COPD, the following treatments and lifestyle changes may be prescribed.  Quitting smoking   Avoiding cigarette smoke and other irritants  Taking medications including: a. bronchodilators b. anti-inflammatory agents c. oxygen d. antibiotics  Maintaining a healthy diet  Following a structured exercise program such as pulmonary rehabilitation Preventing respiratory infections  Controlling stress   If your COPD progresses, you may be eligible to be evaluated for lung volume reduction surgery or lung transplantation. You may also be eligible to participate in certain clinical trials (research studies). Ask your health care providers about studies being conducted in your hospital.   What is the outlook? Although COPD can not be cured, its symptoms can be treated and your quality of life can be improved. Your prognosis or outlook for the future will depend on how well your lungs are functioning, your symptoms, and how well you respond to and follow your treatment plan.  What are the benefits of quitting smoking? Quitting smoking can lower your chances of getting or dying from heart disease, lung disease, kidney failure, infection, or cancer. It can also lower your chances of  getting osteoporosis, a condition that makes your bones weak. Plus, quitting smoking can help your skin look younger and reduce the chances that you will have problems with sex.  Quitting smoking will improve your health no matter how old you are, and no matter how long or how much you have smoked.  What should I do if I want to quit smoking? The letters in the word "START" can help you remember the steps to take: S = Set a quit date. T = Tell family, friends, and the people around you that you plan to quit. A = Anticipate or plan ahead for the tough times you'll face while quitting. R = Remove cigarettes and other tobacco products from your home, car, and work. T = Talk to your doctor about getting help to quit.  How can my doctor or nurse help? Your doctor or nurse can give you advice on the best way to quit.  He or she can also put you in touch with counselors or other people you can call for support. Plus, your doctor or nurse can give you medicines to: ?Reduce your craving for cigarettes ?Reduce the unpleasant symptoms that happen when you stop smoking (called "withdrawal symptoms"). You can also get help from a free phone line (1-800-QUIT-NOW) or go online to [facility].  What are the symptoms of withdrawal? The symptoms include: ?Trouble sleeping ?Being irritable, anxious or restless ?Getting frustrated or angry ?Having trouble thinking clearly  Some people who stop smoking become temporarily depressed. Some people need treatment for depression, such as counseling or antidepressant medicines. Depressed people might: ?No longer enjoy or care about doing the things they used to like to do ?Feel sad, down, hopeless, nervous, or cranky most of the day, almost every day ?Lose or gain weight ?Sleep too much or too little ?Feel tired or like they have no energy ?Feel guilty or like they are worth nothing ?Forget things or feel confused ?Move and speak more slowly than usual ?Act restless or have trouble staying still ?Think about death or suicide  If you think you might be depressed, see your doctor or nurse. Only someone trained in mental health can tell for sure if you are depressed. If you ever feel like you might hurt yourself, go straight to the nearest emergency department. Or you can call for an ambulance (in the [place] and [place], [place] 9-1-1) or call your doctor or nurse right away and tell them it is an emergency. You can also reach the [place] National Suicide Prevention Lifeline at (539) 629-1216 or http://walker-sanchez.info/.  How do medicines help you stop smoking? Different medicines work in different ways: ?Nicotine replacement therapy eases withdrawal and reduces your body's craving for nicotine, the main drug found in cigarettes. There are different forms of nicotine  replacement, including skin patches, lozenges, gum, nasal sprays, and "puffers" or inhalers. Many can be bought without a prescription, while others might require one. ?Bupropion is a prescription medicine that reduces your desire to smoke. This medicine is sold under the brand names Zyban and Wellbutrin. It is also available in a generic version, which is cheaper than brand name medicines. ?Varenicline (brand names: Chantix, Champix) is a prescription medicine that reduces withdrawal symptoms and cigarette cravings. If you think you'd like to take varenicline and you have a history of depression, anxiety, or heart disease, discuss this with your doctor or nurse before taking the medicine. Varenicline can also increase the effects of alcohol in some people. It's a good idea to  limit drinking while you're taking it, at least until you know how it affects you.  How does counseling work? Counseling can happen during formal office visits or just over the phone. A counselor can help you: ?Figure out what triggers your smoking and what to do instead ?Overcome cravings ?Figure out what went wrong when you tried to quit before  What works best? Studies show that people have the best luck at quitting if they take medicines to help them quit and work with a Social worker. It might also be helpful to combine nicotine replacement with one of the prescription medicines that help people quit. In some cases, it might even make sense to take bupropion and varenicline together.  What about e-cigarettes? Sometimes people wonder if using electronic cigarettes, or "e-cigarettes," might help them quit smoking. Using e-cigarettes is also called "vaping." Doctors do not recommend e-cigarettes in place of medicines and counseling. That's because e-cigarettes still contain nicotine as well as other substances that might be harmful. It's not clear how they can affect a person's health in the long term.  Will I gain weight if I  quit? Yes, you might gain a few pounds. But quitting smoking will have a much more positive effect on your health than weighing a few pounds more. Plus, you can help prevent some weight gain by being more active and eating less. Taking the medicine bupropion might help control weight gain.   What else can I do to improve my chances of quitting? You can: ?Start exercising. ?Stay away from smokers and places that you associate with smoking. If people close to you smoke, ask them to quit with you. ?Keep gum, hard candy, or something to put in your mouth handy. If you get a craving for a cigarette, try one of these instead. ?Don't give up, even if you start smoking again. It takes most people a few tries before they succeed.  What if I am pregnant and I smoke? If you are pregnant, it's really important for the health of your baby that you quit. Ask your doctor what options you have, and what is safest for your baby

## 2021-12-22 NOTE — Progress Notes (Signed)
Crystal Baker    RL:[id number]    1966/07/29  Primary Care Physician:Mulberry, Benjamine Mola, MD  Referring Physician: Mack Hook, [place],  [place] [address] Reason for Consultation: shortness of breath Date of Consultation: 12/22/2021  Chief complaint:   Chief Complaint  Patient presents with   Consult    asthma     HPI: Crystal Baker is 56 y.o. woman who presents with shortness of breath. Symptoms going on for the last 4-5 year. Reports wheezing, coughing chest tightness with exertion and with rest.   Recent ED visit for covid infection but was outside the window for anti-virals. Did have breathing treatments before she had to leave AMA.  Never been hospitalized for breathing. Has not been prescribed recurrent doses of prednisone or antibiotics  Has been given inhalers and steroids before and these do help. Currently has ventolin inhaler which she takes 1-2 times/day.   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 Exposures: lives at home with boyfriend who quit smoking and now vapes.  Smoking history:  40 pack years x 1 pack. Has been cutting back due to job.   Social History   Occupational History   Occupation: unemployed  Tobacco Use   Smoking status: Every Day    Packs/day: 0.50    Years: 35.00    Pack years: 17.50    Types: Cigarettes   Smokeless tobacco: Never  Vaping Use   Vaping Use: Never used  Substance and Sexual Activity   Alcohol use: No    Alcohol/week: 0.0 standard drinks   Drug use: Yes    Types: Marijuana    Comment: Tried for pain in 2018, but made it worse, so stopped.   Sexual activity: Yes    Birth control/protection: Post-menopausal    Relevant family history:  Family History  Problem Relation Age of Onset   Cancer Sister        Probably oral cancer   Cancer - Lung Neg Hx    Asthma Neg Hx    COPD Neg Hx     Past Medical History:  Diagnosis Date   Anxiety    in the  past   Anxiety and depression    Arthritis    Chronic bilateral low back pain 07/10/2017   Depression    in the past.  Depression and anxiety starting in childhood. No counseling until 2015    PTSD (post-traumatic stress disorder) 07/10/2017   PTSD (post-traumatic stress disorder)    Syncope     Past Surgical History:  Procedure Laterality Date   BACK SURGERY     NECK SURGERY       Physical Exam: Blood pressure 118/70, pulse 83, temperature 98 F (36.7 C), temperature source Oral, height 5\' 10"  (1.778 m), weight 239 lb (108.4 kg), SpO2 94 %. Gen:      No acute distress ENT:  raspy voice, no nasal polyps, mucus membranes moist Lungs:    diminished, occasional rhonchi CV:         Regular rate and rhythm; no murmurs, rubs, or gallops.  No pedal edema Abd:      + bowel sounds; soft, non-tender; no distension MSK: no acute synovitis of DIP or PIP joints, no mechanics hands.  Skin:      Warm and dry; no rashes Neuro: normal speech, no focal facial asymmetry Psych: alert and oriented x3, normal mood and affect   Data Reviewed/Medical Decision Making:  Independent interpretation of tests: Imaging:  Review of patient's chest xray images revealed no acute process. The patient's images have been independently reviewed by me.    PFTs:  No flowsheet data found.  Labs:  Lab Results  Component Value Date   WBC 7.7 12/02/2021   HGB 15.1 (H) 12/02/2021   HCT 45.9 12/02/2021   MCV 97.2 12/02/2021   PLT 196 12/02/2021   Lab Results  Component Value Date   NA 135 12/02/2021   K 4.2 12/02/2021   CL 101 12/02/2021   CO2 25 12/02/2021      Immunization status:  Immunization History  Administered Date(s) Administered   Influenza Inj Mdck Quad Pf 09/10/2017   Tdap 02/11/2018     I reviewed prior external note(s) from ED visits  I reviewed the result(s) of the labs and imaging as noted above.   I have ordered PFT   Assessment:  COPD new diagnosis Tobacco use  disorder  Plan/Recommendations: Will obtain PFTS.  New start maintenance inhaler with LAMA-LABA.  Continue prn albuterol Pre-contemplative with smoking cessation.  Smoking Cessation Counseling:  1. The patient is an everyday smoker and symptomatic due to the following condition copd 2. The patient is currently pre-contemplative in quitting smoking. 3. I advised patient to quit smoking. 4. We identified patient specific barriers to change.  5. I personally spent 3  minutes counseling the patient regarding tobacco use disorder. 6. We discussed management of stress and anxiety to help with smoking cessation, when applicable. 7. We discussed nicotine replacement therapy, Wellbutrin, Chantix as possible options. 8. I advised setting a quit date. 9. Follow?up arranged with our office to continue ongoing discussions. 10.Resources given to patient including quit hotline.   We discussed disease management and progression at length today regarding COPD.  Return to Care: Return in about 6 weeks (around 02/02/2022).  Lenice Llamas, MD [facility] [place]  CC: Mack Hook, MD

## 2022-02-23 ENCOUNTER — Ambulatory Visit: Payer: Medicaid Other | Admitting: Internal Medicine

## 2022-05-22 ENCOUNTER — Emergency Department (HOSPITAL_COMMUNITY)
Admission: EM | Admit: 2022-05-22 | Discharge: 2022-05-22 | Disposition: A | Discharge status: Home / Self Care | Payer: Medicaid Other | Attending: Emergency Medicine | Admitting: Emergency Medicine

## 2022-05-22 ENCOUNTER — Encounter (HOSPITAL_COMMUNITY): Payer: Self-pay | Admitting: Emergency Medicine

## 2022-05-22 DIAGNOSIS — L259 Unspecified contact dermatitis, unspecified cause: Secondary | ICD-10-CM | POA: Diagnosis not present

## 2022-05-22 DIAGNOSIS — R21 Rash and other nonspecific skin eruption: Secondary | ICD-10-CM | POA: Diagnosis present

## 2022-05-22 MED ORDER — PREDNISONE 10 MG (21) PO TBPK: ORAL_TABLET | Freq: Every day | ORAL | 0 refills | Status: DC

## 2022-05-22 MED ORDER — HYDROCORTISONE 1 % EX CREA: TOPICAL_CREAM | CUTANEOUS | 0 refills | Status: DC

## 2022-05-22 NOTE — ED Triage Notes (Signed)
Patient c/o itching rash to face x8 days. States seen at walk in clinic taking prednisone with no relief.

## 2022-05-22 NOTE — Discharge Instructions (Addendum)
Follow up with your PCP Crystal Baker from [facility] within the next 2-3 days for re-evaluation and continued medical management.   A stronger steroid has been sent to the pharmacy.  You are to take this as a tapering dose, further instructions for this have been provided.  A another prescription has been sent to your pharmacy as well, this is a topical over-the-counter strength of hydrocortisone cream.  You may apply this to the face 1-2 times per day for additional relief.  It is very important to avoid the eye area when applying this.  You may also apply vaseline to the area for moisture and additional relief.  Dermatology contact information has been provided for you as well.  Please call and schedule as soon as possible.  Return to the ED for new or worsening symptoms as discussed.

## 2022-05-22 NOTE — ED Provider Notes (Signed)
[facility]-EMERGENCY DEPT Provider Note   CSN: [id number] Arrival date & time: 05/22/22  0741     History {Add pertinent medical, surgical, social history, OB history to HPI:1} Chief Complaint  Patient presents with  . Rash    Crystal Baker is a 56 y.o. female presenting today with 8 days of worsening facial rash.  Started around the eyes, and spread throughout the face.  First appeared, then eventually became itchy.  Denies purulent discharge, bleeding, blisters, fevers, neck stiffness, shortness of breath, vision changes, difficulty swallowing, or rash in the mouth.  No new skin products, medication changes, or known allergens.  Mild allergy to naproxen, but has not used this recently.  Seen at urgent care 4 days ago, was given prednisone 20 mg for 7 days, has 2 days left.  Rash has not worsened or improved while on the prednisone.  Denies rash present anywhere else.  No other complaints at this time.  The history is provided by the patient and medical records.  Rash      Home Medications Prior to Admission medications   Medication Sig Start Date End Date Taking? Authorizing Provider  acetaminophen (TYLENOL) 500 MG tablet Take 500 mg by mouth every 6 (six) hours as needed for mild pain or headache. Patient not taking: Reported on 12/22/2021    [provider]  albuterol (PROVENTIL HFA;VENTOLIN HFA) 108 (90 Base) MCG/ACT inhaler Inhale 2 puffs into the lungs every 6 (six) hours as needed for wheezing or shortness of breath. Patient not taking: Reported on 04/13/2020 02/11/18   Julieanne Manson, MD  ARIPiprazole (ABILIFY) 10 MG tablet Take by mouth.    [provider]  ARIPiprazole (ABILIFY) 15 MG tablet Take 1 tablet (15 mg total) by mouth daily. 07/27/17   Julieanne Manson, MD  azithromycin (ZITHROMAX) 250 MG tablet 2 tabs by mouth today, then 1 tab by mouth daily for 4 more days Patient not taking: Reported on 04/13/2020 02/11/18    Julieanne Manson, MD  buPROPion [facility]) 100 MG tablet Take by mouth.    [provider]  cephALEXin (KEFLEX) 500 MG capsule Take 1 capsule (500 mg total) by mouth 4 (four) times daily. Patient not taking: Reported on 02/11/2018 02/10/18   Bethel Born, PA-C  cetirizine (ZYRTEC) 10 MG tablet Take 1 tablet (10 mg total) by mouth daily. Patient not taking: Reported on 04/13/2020 02/11/18   Julieanne Manson, MD  FLUoxetine (PROZAC) 10 MG capsule 1 capsule    [provider]  FLUoxetine (PROZAC) 20 MG tablet Take 1 tablet (20 mg total) by mouth daily. Patient not taking: Reported on 04/13/2020 07/27/17   Julieanne Manson, MD  HYDROcodone-acetaminophen (NORCO/VICODIN) 5-325 MG tablet Take 1 tablet by mouth every 6 (six) hours as needed for severe pain. Patient not taking: Reported on 04/13/2020 02/11/20   Antony Madura, PA-C  lidocaine (LIDODERM) 5 % Place 1 patch onto the skin daily. Remove & Discard patch within 12 hours or as directed by MD Patient not taking: Reported on 04/13/2020 02/11/20   Antony Madura, PA-C  methocarbamol (ROBAXIN) 500 MG tablet Take 1 tablet (500 mg total) by mouth every 12 (twelve) hours as needed for muscle spasms. Patient not taking: Reported on 04/13/2020 02/11/20   Antony Madura, PA-C  oxyCODONE-acetaminophen (PERCOCET/ROXICET) 5-325 MG tablet Take 1 tablet by mouth every 8 (eight) hours as needed for severe pain. Patient not taking: Reported on 12/22/2021 04/13/20   Dietrich Pates, PA-C  pregabalin (LYRICA) 100 MG capsule 2  caps by mouth 3 times daily Patient not taking: Reported on 04/13/2020 01/17/18   Julieanne Manson, MD  umeclidinium-vilanterol [facility] ELLIPTA) 62.5-25 MCG/ACT AEPB Inhale 1 puff into the lungs daily. 12/22/21   Charlott Holler, MD      Allergies    Naproxen, Prednisone, and Tramadol    Review of Systems   Review of Systems  Skin:  Positive for rash.    Physical Exam Updated Vital Signs BP (!) 137/91 (BP Location: Left  Arm)   Pulse 73   Temp 98.1 F (36.7 C) (Oral)   Resp 18   Ht 5\' 10"  (1.778 m)   Wt 104.3 kg   SpO2 94%   BMI 33.00 kg/m  Physical Exam Vitals and nursing note reviewed.  Constitutional:      General: She is not in acute distress.    Appearance: She is well-developed.  HENT:     Head: Normocephalic and atraumatic.  Eyes:     Conjunctiva/sclera: Conjunctivae normal.  Cardiovascular:     Rate and Rhythm: Normal rate and regular rhythm.     Heart sounds: No murmur heard. Pulmonary:     Effort: Pulmonary effort is normal. No respiratory distress.     Breath sounds: Normal breath sounds.  Abdominal:     Palpations: Abdomen is soft.     Tenderness: There is no abdominal tenderness.  Musculoskeletal:        General: No swelling.     Cervical back: Neck supple.  Skin:    General: Skin is warm and dry.     Capillary Refill: Capillary refill takes less than 2 seconds.     Findings: Erythema and rash present.  Neurological:     Mental Status: She is alert.  Psychiatric:        Mood and Affect: Mood normal.         ED Results / Procedures / Treatments   Labs (all labs ordered are listed, but only abnormal results are displayed) Labs Reviewed - No data to display  EKG None  Radiology No results found.  Procedures Procedures  {Document cardiac monitor, telemetry assessment procedure when appropriate:1}  Medications Ordered in ED Medications - No data to display  ED Course/ Medical Decision Making/ A&P                           Medical Decision Making  56 y.o. female presents to the ED for concern of Rash   This involves an extensive number of treatment options, and is a complaint that carries with it a high risk of complications and morbidity.   Past Medical History / Co-morbidities / Social History: Hx of anxiety Social Determinants of Health include: None  Additional History:  Internal and external records from outside source obtained and reviewed  including ***  Lab Tests: I ordered, and personally interpreted labs.  The pertinent results include:   CBC: CMP/BMP:  Imaging Studies: None  ED Course: Pt well-appearing on exam.  ***.    Patient in NAD and in good condition at time of discharge.  Disposition: After consideration the patient's encounter today, I do not feel today's workup suggests an emergent condition requiring admission or immediate intervention beyond what has been performed at this time.  Safe for discharge; instructed to return immediately for worsening symptoms, change in symptoms or any other concerns.  I have reviewed the patients home medicines and have made adjustments as needed.  Discussed course of  treatment with the patient, whom demonstrated understanding.  Patient in agreement and has no further questions.    I discussed this case with my attending physician Dr. Regenia Skeeter, who agreed with the proposed treatment course and cosigned this note including patient's presenting symptoms, physical exam, and planned diagnostics and interventions.  Attending physician stated agreement with plan or made changes to plan which were implemented.     This chart was dictated using voice recognition software.  Despite best efforts to proofread, errors can occur which can change the documentation meaning.   {Document critical care time when appropriate:1} {Document review of labs and clinical decision tools ie heart score, Chads2Vasc2 etc:1}  {Document your independent review of radiology images, and any outside records:1} {Document your discussion with family members, caretakers, and with consultants:1} {Document social determinants of health affecting pt's care:1} {Document your decision making why or why not admission, treatments were needed:1} Final Clinical Impression(s) / ED Diagnoses Final diagnoses:  None    Rx / DC Orders ED Discharge Orders     None

## 2023-03-08 ENCOUNTER — Other Ambulatory Visit: Payer: Self-pay

## 2023-03-08 ENCOUNTER — Emergency Department (HOSPITAL_COMMUNITY)
Admission: EM | Admit: 2023-03-08 | Discharge: 2023-03-08 | Disposition: A | Discharge status: Home / Self Care | Payer: Self-pay | Attending: Student in an Organized Health Care Education/Training Program | Admitting: Student in an Organized Health Care Education/Training Program

## 2023-03-08 ENCOUNTER — Emergency Department (HOSPITAL_COMMUNITY): Payer: Self-pay

## 2023-03-08 DIAGNOSIS — I10 Essential (primary) hypertension: Secondary | ICD-10-CM | POA: Insufficient documentation

## 2023-03-08 DIAGNOSIS — R519 Headache, unspecified: Secondary | ICD-10-CM | POA: Insufficient documentation

## 2023-03-08 DIAGNOSIS — Z7951 Long term (current) use of inhaled steroids: Secondary | ICD-10-CM | POA: Insufficient documentation

## 2023-03-08 MED ORDER — DROPERIDOL 2.5 MG/ML IJ SOLN
1.2500 mg | Freq: Once | INTRAMUSCULAR | Status: AC
  Administered 2023-03-08: 1.25 mg via INTRAVENOUS
  Filled 2023-03-08: qty 2

## 2023-03-08 MED ORDER — LORAZEPAM 2 MG/ML IJ SOLN
0.5000 mg | Freq: Once | INTRAMUSCULAR | Status: AC
  Administered 2023-03-08: 0.5 mg via INTRAVENOUS
  Filled 2023-03-08: qty 1

## 2023-03-08 MED ORDER — LACTATED RINGERS IV BOLUS
1000.0000 mL | Freq: Once | INTRAVENOUS | Status: AC
  Administered 2023-03-08: 1000 mL via INTRAVENOUS

## 2023-03-08 MED ORDER — DIPHENHYDRAMINE HCL 25 MG PO CAPS
50.0000 mg | ORAL_CAPSULE | Freq: Once | ORAL | Status: AC
Start: 2023-03-08 — End: 2023-03-08
  Administered 2023-03-08: 50 mg via ORAL
  Filled 2023-03-08: qty 2

## 2023-03-08 MED ORDER — METOCLOPRAMIDE HCL 5 MG/ML IJ SOLN
10.0000 mg | Freq: Once | INTRAMUSCULAR | Status: AC
Start: 2023-03-08 — End: 2023-03-08
  Administered 2023-03-08: 10 mg via INTRAVENOUS
  Filled 2023-03-08: qty 2

## 2023-03-08 MED ORDER — MORPHINE SULFATE (PF) 4 MG/ML IV SOLN
4.0000 mg | Freq: Once | INTRAVENOUS | Status: AC
  Administered 2023-03-08: 4 mg via INTRAVENOUS
  Filled 2023-03-08: qty 1

## 2023-03-08 NOTE — ED Provider Notes (Signed)
[place] EMERGENCY DEPARTMENT AT [facility] Provider Note   CSN: [id number] Arrival date & time: 03/08/23  1347     History  Chief Complaint  Patient presents with   Headache    Crystal Baker is a 57 y.o. female.  57 year old female presents emergency department today complaining of a headache x 3 days.  Patient states she was getting ready for work when she began experiencing this frontal headache.  She denies any history of headaches or migraines previously.  She has been taking Tylenol and ibuprofen at home without improvement.  She denies any recent trauma, falls, history of intracranial bleeds, history of hypertension, visual disturbances, worsening with Valsalva, or confusion.  Patient denies any recent travel.  No medications prior to arrival.   Headache      Home Medications Prior to Admission medications   Medication Sig Start Date End Date Taking? Authorizing Provider  acetaminophen (TYLENOL) 500 MG tablet Take 500 mg by mouth every 6 (six) hours as needed for mild pain or headache. Patient not taking: Reported on 12/22/2021    [provider]  albuterol (PROVENTIL HFA;VENTOLIN HFA) 108 (90 Base) MCG/ACT inhaler Inhale 2 puffs into the lungs every 6 (six) hours as needed for wheezing or shortness of breath. Patient not taking: Reported on 04/13/2020 02/11/18   Julieanne Manson, MD  ARIPiprazole (ABILIFY) 10 MG tablet Take by mouth.    [provider]  ARIPiprazole (ABILIFY) 15 MG tablet Take 1 tablet (15 mg total) by mouth daily. 07/27/17   Julieanne Manson, MD  azithromycin (ZITHROMAX) 250 MG tablet 2 tabs by mouth today, then 1 tab by mouth daily for 4 more days Patient not taking: Reported on 04/13/2020 02/11/18   Julieanne Manson, MD  buPROPion [facility]) 100 MG tablet Take by mouth.    [provider]  cephALEXin (KEFLEX) 500 MG capsule Take 1 capsule (500 mg total) by mouth 4 (four) times daily. Patient not  taking: Reported on 02/11/2018 02/10/18   Bethel Born, PA-C  cetirizine (ZYRTEC) 10 MG tablet Take 1 tablet (10 mg total) by mouth daily. Patient not taking: Reported on 04/13/2020 02/11/18   Julieanne Manson, MD  FLUoxetine (PROZAC) 10 MG capsule 1 capsule    [provider]  FLUoxetine (PROZAC) 20 MG tablet Take 1 tablet (20 mg total) by mouth daily. Patient not taking: Reported on 04/13/2020 07/27/17   Julieanne Manson, MD  HYDROcodone-acetaminophen (NORCO/VICODIN) 5-325 MG tablet Take 1 tablet by mouth every 6 (six) hours as needed for severe pain. Patient not taking: Reported on 04/13/2020 02/11/20   Antony Madura, PA-C  hydrocortisone cream 1 % Apply to affected area 2 times daily 05/22/22   Cecil Cobbs, PA-C  lidocaine (LIDODERM) 5 % Place 1 patch onto the skin daily. Remove & Discard patch within 12 hours or as directed by MD Patient not taking: Reported on 04/13/2020 02/11/20   Antony Madura, PA-C  methocarbamol (ROBAXIN) 500 MG tablet Take 1 tablet (500 mg total) by mouth every 12 (twelve) hours as needed for muscle spasms. Patient not taking: Reported on 04/13/2020 02/11/20   Antony Madura, PA-C  oxyCODONE-acetaminophen (PERCOCET/ROXICET) 5-325 MG tablet Take 1 tablet by mouth every 8 (eight) hours as needed for severe pain. Patient not taking: Reported on 12/22/2021 04/13/20   Dietrich Pates, PA-C  predniSONE (STERAPRED UNI-PAK 21 TAB) 10 MG (21) TBPK tablet Take by mouth daily. Take 6 tabs by mouth daily for 2 days, then 5 tabs for 2 days, then  4 tabs for 2 days, then 3 tabs for 2 days, 2 tabs for 2 days, then 1 tab by mouth daily for 2 days 05/22/22   Cecil Cobbs, PA-C  pregabalin (LYRICA) 100 MG capsule 2 caps by mouth 3 times daily Patient not taking: Reported on 04/13/2020 01/17/18   Julieanne Manson, MD  umeclidinium-vilanterol [facility] ELLIPTA) 62.5-25 MCG/ACT AEPB Inhale 1 puff into the lungs daily. 12/22/21   Charlott Holler, MD      Allergies    Naproxen,  Prednisone, and Tramadol    Review of Systems   Review of Systems  Neurological:  Positive for headaches.  All other systems reviewed and are negative.   Physical Exam Updated Vital Signs BP (!) 154/90 (BP Location: Right Arm)   Temp 98.3 F (36.8 C) (Oral)   Resp 18   Ht 5\' 10"  (1.778 m)   Wt 104 kg   SpO2 95%   BMI 32.90 kg/m  Physical Exam Vitals and nursing note reviewed.  Constitutional:      General: She is not in acute distress.    Appearance: She is well-developed. She is not toxic-appearing.  HENT:     Head: Normocephalic and atraumatic.     Mouth/Throat:     Mouth: Mucous membranes are moist.  Eyes:     Extraocular Movements: Extraocular movements intact.     Right eye: Normal extraocular motion.     Left eye: Normal extraocular motion.     Conjunctiva/sclera: Conjunctivae normal.     Pupils: Pupils are equal, round, and reactive to light.  Cardiovascular:     Rate and Rhythm: Normal rate and regular rhythm.     Heart sounds: No murmur heard. Pulmonary:     Effort: Pulmonary effort is normal. No respiratory distress.     Breath sounds: Normal breath sounds.  Abdominal:     Palpations: Abdomen is soft.     Tenderness: There is no abdominal tenderness.  Musculoskeletal:        General: No swelling.     Cervical back: Normal range of motion and neck supple.  Skin:    General: Skin is warm and dry.     Capillary Refill: Capillary refill takes less than 2 seconds.  Neurological:     Mental Status: She is alert and oriented to person, place, and time.     GCS: GCS eye subscore is 4. GCS verbal subscore is 5. GCS motor subscore is 6.     Cranial Nerves: No cranial nerve deficit, dysarthria or facial asymmetry.     Coordination: Coordination normal.     Gait: Gait normal.  Psychiatric:        Mood and Affect: Mood normal.     ED Results / Procedures / Treatments   Labs (all labs ordered are listed, but only abnormal results are displayed) Labs Reviewed  - No data to display  EKG None  Radiology CT Head Wo Contrast  Result Date: 03/08/2023 CLINICAL DATA:  Headache, sudden, severe. EXAM: CT HEAD WITHOUT CONTRAST TECHNIQUE: Contiguous axial images were obtained from the base of the skull through the vertex without intravenous contrast. RADIATION DOSE REDUCTION: This exam was performed according to the departmental dose-optimization program which includes automated exposure control, adjustment of the mA and/or kV according to patient size and/or use of iterative reconstruction technique. COMPARISON:  None Available. FINDINGS: Brain: No acute intracranial hemorrhage. Gray-white differentiation is preserved. No hydrocephalus or extra-axial collection. No mass effect or midline shift. Vascular: No hyperdense  vessel or unexpected calcification. Skull: No calvarial fracture or suspicious bone lesion. Skull base is unremarkable. Sinuses/Orbits: Unremarkable. Other: None. IMPRESSION: No acute intracranial abnormality. Electronically Signed   By: Orvan Falconer M.D.   On: 03/08/2023 15:36    Procedures Procedures    Medications Ordered in ED Medications  droperidol (INAPSINE) 2.5 MG/ML injection 1.25 mg (has no administration in time range)  LORazepam (ATIVAN) injection 0.5 mg (has no administration in time range)  morphine (PF) 4 MG/ML injection 4 mg (4 mg Intravenous Given 03/08/23 1504)  lactated ringers bolus 1,000 mL (0 mLs Intravenous Stopped 03/08/23 1558)  metoCLOPramide (REGLAN) injection 10 mg (10 mg Intravenous Given 03/08/23 1553)  diphenhydrAMINE (BENADRYL) capsule 50 mg (50 mg Oral Given 03/08/23 1552)    ED Course/ Medical Decision Making/ A&P                             Medical Decision Making 57 year old female presenting with a headache x 3 days.  We did go ahead and get a CT scan to evaluate for any intracranial hemorrhage since she does not have a history of migraines.  Differential includes intracranial hemorrhage, dehydration,  migraine, hypertension, CVA, or infectious causes.  Patient is overall well-appearing and afebrile.  Neurological exam is unremarkable.  CT scan of the head showed no acute abnormalities at this time.  Patient did receive morphine and IV fluids with improvement in her headache level.  We also gave Benadryl and Reglan, which she reports has also helped.  She is still reporting a 5/10 headache so we will proceed with droperidol at this time. Patient signed out to the evening physician for further care.  Amount and/or Complexity of Data Reviewed Radiology: ordered.  Risk Prescription drug management.          Final Clinical Impression(s) / ED Diagnoses Final diagnoses:  None    Rx / DC Orders ED Discharge Orders     None         Sharifa Bucholz, DO 03/08/23 1633

## 2023-03-08 NOTE — ED Triage Notes (Signed)
Pt reports headache x 3 days. One episode of emesis this morning

## 2023-03-08 NOTE — ED Provider Notes (Signed)
  Physical Exam  BP (!) 154/90 (BP Location: Right Arm)   Temp 98.3 F (36.8 C) (Oral)   Resp 18   Ht 5\' 10"  (1.778 m)   Wt 104 kg   SpO2 95%   BMI 32.90 kg/m   Physical Exam Vitals and nursing note reviewed.  Constitutional:      General: She is not in acute distress.    Appearance: She is well-developed.  HENT:     Head: Normocephalic and atraumatic.  Eyes:     Conjunctiva/sclera: Conjunctivae normal.  Cardiovascular:     Rate and Rhythm: Normal rate and regular rhythm.     Heart sounds: No murmur heard. Pulmonary:     Effort: Pulmonary effort is normal. No respiratory distress.     Breath sounds: Normal breath sounds.  Abdominal:     Palpations: Abdomen is soft.     Tenderness: There is no abdominal tenderness.  Musculoskeletal:        General: No swelling.     Cervical back: Neck supple.  Skin:    General: Skin is warm and dry.     Capillary Refill: Capillary refill takes less than 2 seconds.  Neurological:     Mental Status: She is alert.     Cranial Nerves: No cranial nerve deficit.     Sensory: No sensory deficit.     Motor: No weakness.  Psychiatric:        Mood and Affect: Mood normal.     Procedures  Procedures  ED Course / MDM    Medical Decision Making Amount and/or Complexity of Data Reviewed Radiology: ordered.  Risk Prescription drug management.   Patient received in handoff.  Headache for 3 days.  Significant improvement with initial headache cocktail from 10 to 5,  but pain mildly persistent.  Patient received second round with droperidol Ativan on reevaluation headache has resolved.  CT head ordered by previous provider with no acute intracranial disease.  On reevaluation, patient with normal neurologic exam, no focal motor or sensory deficits, no cranial nerve deficits.  Low suspicion for sentinel bleed or subarachnoid hemorrhage given gradual onset nature of headache.  With normal neurologic exam, low suspicion for mass.  Patient then  discharged with outpatient follow-up and return precautions of which she voiced understanding       Glendora Score, MD 03/08/23 [phone]

## 2023-09-25 ENCOUNTER — Other Ambulatory Visit: Payer: Self-pay

## 2023-09-25 ENCOUNTER — Inpatient Hospital Stay (HOSPITAL_COMMUNITY)
Admission: EM | Admit: 2023-09-25 | Discharge: 2023-09-27 | DRG: 379 | Disposition: A | Discharge status: Home / Self Care | Payer: Self-pay | Attending: Internal Medicine | Admitting: Internal Medicine

## 2023-09-25 ENCOUNTER — Encounter (HOSPITAL_COMMUNITY): Payer: Self-pay

## 2023-09-25 ENCOUNTER — Emergency Department (HOSPITAL_COMMUNITY): Payer: Self-pay

## 2023-09-25 DIAGNOSIS — Z79899 Other long term (current) drug therapy: Secondary | ICD-10-CM

## 2023-09-25 DIAGNOSIS — K5732 Diverticulitis of large intestine without perforation or abscess without bleeding: Secondary | ICD-10-CM | POA: Diagnosis present

## 2023-09-25 DIAGNOSIS — F17210 Nicotine dependence, cigarettes, uncomplicated: Secondary | ICD-10-CM | POA: Diagnosis present

## 2023-09-25 DIAGNOSIS — F419 Anxiety disorder, unspecified: Secondary | ICD-10-CM | POA: Diagnosis present

## 2023-09-25 DIAGNOSIS — Z7951 Long term (current) use of inhaled steroids: Secondary | ICD-10-CM

## 2023-09-25 DIAGNOSIS — K5792 Diverticulitis of intestine, part unspecified, without perforation or abscess without bleeding: Principal | ICD-10-CM

## 2023-09-25 DIAGNOSIS — K5733 Diverticulitis of large intestine without perforation or abscess with bleeding: Principal | ICD-10-CM | POA: Diagnosis present

## 2023-09-25 DIAGNOSIS — F4310 Post-traumatic stress disorder, unspecified: Secondary | ICD-10-CM | POA: Diagnosis present

## 2023-09-25 DIAGNOSIS — F32A Depression, unspecified: Secondary | ICD-10-CM | POA: Diagnosis present

## 2023-09-25 DIAGNOSIS — Z23 Encounter for immunization: Secondary | ICD-10-CM

## 2023-09-25 DIAGNOSIS — Z808 Family history of malignant neoplasm of other organs or systems: Secondary | ICD-10-CM

## 2023-09-25 LAB — COMPREHENSIVE METABOLIC PANEL
ALT: 51 U/L — ABNORMAL HIGH (ref 0–44)
AST: 42 U/L — ABNORMAL HIGH (ref 15–41)
Albumin: 4 g/dL (ref 3.5–5.0)
Alkaline Phosphatase: 75 U/L (ref 38–126)
Anion gap: 11 (ref 5–15)
BUN: 8 mg/dL (ref 6–20)
CO2: 24 mmol/L (ref 22–32)
Calcium: 9.7 mg/dL (ref 8.9–10.3)
Chloride: 104 mmol/L (ref 98–111)
Creatinine, Ser: 0.81 mg/dL (ref 0.44–1.00)
GFR, Estimated: >60 mL/min (ref >60)
Glucose, Bld: 110 mg/dL — ABNORMAL HIGH (ref 70–99)
Potassium: 3.9 mmol/L (ref 3.5–5.1)
Sodium: 139 mmol/L (ref 135–145)
Total Bilirubin: 1.1 mg/dL (ref <1.2)
Total Protein: 7.8 g/dL (ref 6.5–8.1)

## 2023-09-25 LAB — CBC WITH DIFFERENTIAL/PLATELET
Abs Immature Granulocytes: 0.02 10*3/uL (ref 0.00–0.07)
Basophils Absolute: 0 10*3/uL (ref 0.0–0.1)
Basophils Relative: 1 %
Eosinophils Absolute: 0.2 10*3/uL (ref 0.0–0.5)
Eosinophils Relative: 3 %
HCT: 47.9 % — ABNORMAL HIGH (ref 36.0–46.0)
Hemoglobin: 16 g/dL — ABNORMAL HIGH (ref 12.0–15.0)
Immature Granulocytes: 0 %
Lymphocytes Relative: 22 %
Lymphs Abs: 1.3 10*3/uL (ref 0.7–4.0)
MCH: 33.4 pg (ref 26.0–34.0)
MCHC: 33.4 g/dL (ref 30.0–36.0)
MCV: 100 fL (ref 80.0–100.0)
Monocytes Absolute: 0.6 10*3/uL (ref 0.1–1.0)
Monocytes Relative: 10 %
Neutro Abs: 3.8 10*3/uL (ref 1.7–7.7)
Neutrophils Relative %: 64 %
Platelets: 177 10*3/uL (ref 150–400)
RBC: 4.79 MIL/uL (ref 3.87–5.11)
RDW: 12 % (ref 11.5–15.5)
WBC: 5.9 10*3/uL (ref 4.0–10.5)
nRBC: 0 % (ref 0.0–0.2)

## 2023-09-25 LAB — ABO/RH: ABO/RH(D): O POS

## 2023-09-25 LAB — TYPE AND SCREEN
ABO/RH(D): O POS
Antibody Screen: NEGATIVE

## 2023-09-25 LAB — HIV ANTIBODY (ROUTINE TESTING W REFLEX): HIV Screen 4th Generation wRfx: NONREACTIVE

## 2023-09-25 MED ORDER — MORPHINE SULFATE (PF) 4 MG/ML IV SOLN
6.0000 mg | Freq: Once | INTRAVENOUS | Status: AC
  Administered 2023-09-25: 6 mg via INTRAVENOUS
  Filled 2023-09-25: qty 2

## 2023-09-25 MED ORDER — HYDRALAZINE HCL 20 MG/ML IJ SOLN: 5.0000 mg | Freq: Four times a day (QID) | INTRAMUSCULAR | Status: DC | PRN

## 2023-09-25 MED ORDER — SODIUM CHLORIDE 0.9 % IV SOLN
3.0000 g | Freq: Four times a day (QID) | INTRAVENOUS | Status: DC
  Administered 2023-09-25 – 2023-09-27 (×7): 3 g via INTRAVENOUS
  Filled 2023-09-25 (×8): qty 8

## 2023-09-25 MED ORDER — ONDANSETRON HCL 4 MG/2ML IJ SOLN: 4.0000 mg | Freq: Four times a day (QID) | INTRAMUSCULAR | Status: DC | PRN

## 2023-09-25 MED ORDER — TRAZODONE HCL 50 MG PO TABS: 25.0000 mg | ORAL_TABLET | Freq: Every evening | ORAL | Status: DC | PRN

## 2023-09-25 MED ORDER — IOHEXOL 350 MG/ML SOLN
100.0000 mL | Freq: Once | INTRAVENOUS | Status: AC | PRN
  Administered 2023-09-25: 100 mL via INTRAVENOUS

## 2023-09-25 MED ORDER — ONDANSETRON HCL 4 MG PO TABS
4.0000 mg | ORAL_TABLET | Freq: Four times a day (QID) | ORAL | Status: DC | PRN
  Administered 2023-09-27: 4 mg via ORAL
  Filled 2023-09-25: qty 1

## 2023-09-25 MED ORDER — HYDROMORPHONE HCL 1 MG/ML IJ SOLN
1.0000 mg | INTRAMUSCULAR | Status: DC | PRN
  Administered 2023-09-25 – 2023-09-27 (×6): 1 mg via INTRAVENOUS
  Filled 2023-09-25 (×6): qty 1

## 2023-09-25 MED ORDER — OXYCODONE HCL 5 MG PO TABS
5.0000 mg | ORAL_TABLET | ORAL | Status: DC | PRN
  Administered 2023-09-25 – 2023-09-27 (×5): 5 mg via ORAL
  Filled 2023-09-25 (×5): qty 1

## 2023-09-25 MED ORDER — SODIUM CHLORIDE (PF) 0.9 % IJ SOLN
INTRAMUSCULAR | Status: AC
  Filled 2023-09-25: qty 50

## 2023-09-25 MED ORDER — SODIUM CHLORIDE 0.9 % IV SOLN
3.0000 g | Freq: Once | INTRAVENOUS | Status: AC
Start: 2023-09-25 — End: 2023-09-25
  Administered 2023-09-25: 3 g via INTRAVENOUS
  Filled 2023-09-25: qty 8

## 2023-09-25 MED ORDER — ACETAMINOPHEN 650 MG RE SUPP: 650.0000 mg | Freq: Four times a day (QID) | RECTAL | Status: DC | PRN

## 2023-09-25 MED ORDER — INFLUENZA VIRUS VACC SPLIT PF (FLUZONE) 0.5 ML IM SUSY
0.5000 mL | PREFILLED_SYRINGE | INTRAMUSCULAR | Status: AC
  Administered 2023-09-26: 0.5 mL via INTRAMUSCULAR
  Filled 2023-09-25 (×2): qty 0.5

## 2023-09-25 MED ORDER — ACETAMINOPHEN 325 MG PO TABS: 650.0000 mg | ORAL_TABLET | Freq: Four times a day (QID) | ORAL | Status: DC | PRN

## 2023-09-25 MED ORDER — ONDANSETRON HCL 4 MG/2ML IJ SOLN
4.0000 mg | Freq: Once | INTRAMUSCULAR | Status: AC
  Administered 2023-09-25: 4 mg via INTRAVENOUS
  Filled 2023-09-25: qty 2

## 2023-09-25 NOTE — ED Notes (Signed)
ED TO INPATIENT HANDOFF REPORT  Name/Age/Gender Crystal Baker 57 y.o. female  Code Status    Code Status Orders  (From admission, onward)           Start     Ordered   09/25/23 1322  Full code  Continuous       Question:  By:  Answer:  Consent: discussion documented in EHR   09/25/23 1322           Code Status History     Date Active Date Inactive Code Status Order ID Comments User Context   03/29/2016 1240 03/30/2016 1816 Full Code [id number]  Yolanda Manges, DO Inpatient       Home/SNF/Other Home  Chief Complaint Diverticulitis large intestine [K57.32]  Level of Care/Admitting Diagnosis ED Disposition     ED Disposition  Admit   Condition  --   Comment  Hospital Area: [facility] [[id number]]  Level of Care: Telemetry [5]  Admit to tele based on following criteria: Other see comments  Comments: gib  May place patient in observation at [facility] or [facility] if equivalent level of care is available:: Yes  Covid Evaluation: Asymptomatic - no recent exposure (last 10 days) testing not required  Diagnosis: Diverticulitis large intestine [528413]  Admitting Physician: Maryln Gottron [[id number]]  Attending Physician: Kirby Crigler, MIR [email or web address] [[id number]]          Medical History Past Medical History:  Diagnosis Date   Anxiety    in the past   Anxiety and depression    Arthritis    Chronic bilateral low back pain 07/10/2017   Depression    in the past.  Depression and anxiety starting in childhood. No counseling until 2015    PTSD (post-traumatic stress disorder) 07/10/2017   PTSD (post-traumatic stress disorder)    Syncope     Allergies Allergies  Allergen Reactions   Naproxen Itching and Rash    Palpitations in high doses   Prednisone Palpitations   Tramadol Itching and Rash    Palpitations     IV Location/Drains/Wounds Patient Lines/Drains/Airways Status     Active Line/Drains/Airways     Name Placement date  Placement time Site Days   Peripheral IV 09/25/23 20 G 1" Right Antecubital 09/25/23  0945  Antecubital  less than 1            Labs/Imaging Results for orders placed or performed during the hospital encounter of 09/25/23 (from the past 48 hour(s))  CBC with Differential/Platelet     Status: Abnormal   Collection Time: 09/25/23  9:35 AM  Result Value Ref Range   WBC 5.9 4.0 - 10.5 K/uL   RBC 4.79 3.87 - 5.11 MIL/uL   Hemoglobin 16.0 (H) 12.0 - 15.0 g/dL   HCT 40.3 (H) 47.4 - 25.9 %   MCV 100.0 80.0 - 100.0 fL   MCH 33.4 26.0 - 34.0 pg   MCHC 33.4 30.0 - 36.0 g/dL   RDW 56.3 87.5 - 64.3 %   Platelets 177 150 - 400 K/uL   nRBC 0.0 0.0 - 0.2 %   Neutrophils Relative % 64 %   Neutro Abs 3.8 1.7 - 7.7 K/uL   Lymphocytes Relative 22 %   Lymphs Abs 1.3 0.7 - 4.0 K/uL   Monocytes Relative 10 %   Monocytes Absolute 0.6 0.1 - 1.0 K/uL   Eosinophils Relative 3 %   Eosinophils Absolute 0.2 0.0 - 0.5 K/uL   Basophils Relative 1 %  Basophils Absolute 0.0 0.0 - 0.1 K/uL   Immature Granulocytes 0 %   Abs Immature Granulocytes 0.02 0.00 - 0.07 K/uL    Comment: Performed at [facility], [address], [place], [place] [address]  Comprehensive metabolic panel     Status: Abnormal   Collection Time: 09/25/23  9:35 AM  Result Value Ref Range   Sodium 139 135 - 145 mmol/L   Potassium 3.9 3.5 - 5.1 mmol/L   Chloride 104 98 - 111 mmol/L   CO2 24 22 - 32 mmol/L   Glucose, Bld 110 (H) 70 - 99 mg/dL    Comment: Glucose reference range applies only to samples taken after fasting for at least 8 hours.   BUN 8 6 - 20 mg/dL   Creatinine, Ser 4.78 0.44 - 1.00 mg/dL   Calcium 9.7 8.9 - 29.5 mg/dL   Total Protein 7.8 6.5 - 8.1 g/dL   Albumin 4.0 3.5 - 5.0 g/dL   AST 42 (H) 15 - 41 U/L   ALT 51 (H) 0 - 44 U/L   Alkaline Phosphatase 75 38 - 126 U/L   Total Bilirubin 1.1 <1.2 mg/dL   GFR, Estimated >62 >13 mL/min    Comment: (NOTE) Calculated using the CKD-EPI Creatinine  Equation (2021)    Anion gap 11 5 - 15    Comment: Performed at [facility], [address] [place], [place] [address]  ABO/Rh     Status: None   Collection Time: 09/25/23  9:35 AM  Result Value Ref Range   ABO/RH(D)      O POS Performed at [facility], [address], [place], [place] [address]   Type and screen     Status: None   Collection Time: 09/25/23  9:40 AM  Result Value Ref Range   ABO/RH(D) O POS    Antibody Screen NEG    Sample Expiration      09/28/2023,2359 Performed at [facility], [address] [place], [place] [address]    CT ANGIO GI BLEED  Result Date: 09/25/2023 CLINICAL DATA:  57 year old female with report of rectal bleeding EXAM: CTA ABDOMEN AND PELVIS WITHOUT AND WITH CONTRAST TECHNIQUE: Multidetector CT imaging of the abdomen and pelvis was performed using the standard protocol during bolus administration of intravenous contrast. Multiplanar reconstructed images and MIPs were obtained and reviewed to evaluate the vascular anatomy. RADIATION DOSE REDUCTION: This exam was performed according to the departmental dose-optimization program which includes automated exposure control, adjustment of the mA and/or kV according to patient size and/or use of iterative reconstruction technique. CONTRAST:  OMNIPAQUE IOHEXOL 350 MG/ML SOLN COMPARISON:  Chest CT 03/29/2016 FINDINGS: VASCULAR Aorta: Unremarkable course, caliber, contour of the abdominal aorta. No dissection, aneurysm, or periaortic fluid. Mild atherosclerotic changes of the abdominal aorta. Celiac: Patent, with no significant atherosclerotic changes. SMA: Patent, with no significant atherosclerotic changes. Renals: - Right: Right renal artery patent. - Left: Left renal artery patent. IMA: Inferior mesenteric artery is patent. Right lower extremity: Unremarkable course, caliber, and contour of the right iliac system. No aneurysm, dissection, or  occlusion. Hypogastric artery is patent. Common femoral artery patent. Proximal SFA and profunda femoris patent. Left lower extremity: Unremarkable course, caliber, and contour of the left iliac system. No aneurysm, dissection, or occlusion. Hypogastric artery is patent. Common femoral artery patent. Proximal SFA and profunda femoris patent. Veins: Unremarkable appearance of the venous system. Review of the MIP images confirms the above findings. NON-VASCULAR  Lower chest: Mixed pattern of mild ground-glass and reticular opacities and dependent lungs, new from the comparison CT of 2017. Mild bronchial wall thickening with no endoluminal debris. No pleural effusion. Hepatobiliary: Diffusely decreased attenuation of the liver parenchyma compatible with liver steatosis. Cranial caudal span of the liver estimated greater than 18 cm. Unremarkable gall bladder. Pancreas: Unremarkable. Spleen: Unremarkable. Adrenals/Urinary Tract: - Right adrenal gland: Unremarkable - Left adrenal gland: Unremarkable. - Right kidney: No hydronephrosis, nephrolithiasis, inflammation, or ureteral dilation. No focal lesion. - Left Kidney: No hydronephrosis, nephrolithiasis, inflammation, or ureteral dilation. No focal lesion. - Urinary Bladder: Unremarkable. Stomach/Bowel: - Stomach: Hiatal hernia.  Otherwise unremarkable stomach - Small bowel: Unremarkable - Appendix: Normal. - Colon: No significant stool burden. Circumferential colonic wall thickening of the descending colon with associated mild inflammatory edema within the adjacent fat. Diverticular present within descending colon and sigmoid colon. No evidence of abscess or perforation Lymphatic: No adenopathy. Mesenteric: No free air or free fluid. Mild edematous changes of the fat adjacent to descending colon. Reproductive: Unremarkable appearance of the pelvic organs. Other: No hernia. Musculoskeletal: No evidence of acute fracture. No bony canal narrowing. No significant degenerative  changes of the hips. IMPRESSION: CT angiogram is negative for evidence of acute GI hemorrhage. Left colitis, favored to be acute uncomplicated diverticulitis given the background of diverticula. Inflammatory colitis not excluded, and ischemic colitis is unlikely. Liver steatosis and hepatomegaly Changes in the lower lungs may reflect bronchitis and correlation with any pulmonary symptoms would be useful. Aortic Atherosclerosis (ICD10-I70.0). Signed, Yvone Neu. Miachel Roux, RPVI [facility] Electronically Signed   By: Gilmer Mor D.O.   On: 09/25/2023 11:43    Pending Labs Unresulted Labs (From admission, onward)     Start     Ordered   09/26/23 0500  Basic metabolic panel  Tomorrow morning,   R        09/25/23 1322   09/26/23 0500  CBC  Tomorrow morning,   R        09/25/23 1322   09/25/23 1322  HIV Antibody (routine testing w rflx)  (HIV Antibody (Routine testing w reflex) panel)  Once,   R        09/25/23 1322            Vitals/Pain Today's Vitals   09/25/23 0842 09/25/23 0846 09/25/23 1232 09/25/23 1309  BP: (!) 134/101     Pulse: 78     Resp: 19     Temp: (!) 97.5 F (36.4 C)  97.8 F (36.6 C)   TempSrc: Oral  Oral   Weight:  99.8 kg    Height:  5\' 10"  (1.778 m)    PainSc:  8   5     Isolation Precautions No active isolations  Medications Medications  acetaminophen (TYLENOL) tablet 650 mg (has no administration in time range)    Or  acetaminophen (TYLENOL) suppository 650 mg (has no administration in time range)  traZODone (DESYREL) tablet 25 mg (has no administration in time range)  ondansetron (ZOFRAN) tablet 4 mg (has no administration in time range)    Or  ondansetron (ZOFRAN) injection 4 mg (has no administration in time range)  hydrALAZINE (APRESOLINE) injection 5 mg (has no administration in time range)  oxyCODONE (Oxy IR/ROXICODONE) immediate release tablet 5 mg (has no administration in time  range)  HYDROmorphone (DILAUDID) injection 1 mg (has no administration in time range)  Ampicillin-Sulbactam (UNASYN) 3 g in sodium chloride 0.9 %  100 mL IVPB (has no administration in time range)  iohexol (OMNIPAQUE) 350 MG/ML injection 100 mL (100 mLs Intravenous Contrast Given 09/25/23 1049)  Ampicillin-Sulbactam (UNASYN) 3 g in sodium chloride 0.9 % 100 mL IVPB (0 g Intravenous Stopped 09/25/23 1309)  morphine (PF) 4 MG/ML injection 6 mg (6 mg Intravenous Given 09/25/23 1218)  ondansetron (ZOFRAN) injection 4 mg (4 mg Intravenous Given 09/25/23 1217)  morphine (PF) 4 MG/ML injection 6 mg (6 mg Intravenous Given 09/25/23 1314)    Mobility walks

## 2023-09-25 NOTE — H&P (Signed)
History and Physical  Crystal Baker QQV:[id number] DOB: 04-Nov-1965 DOA: 09/25/2023  PCP: Julieanne Manson, MD   Chief Complaint: rectal bleeding   HPI: Crystal Baker is a 57 y.o. female with medical history significant for anxiety and depression, as well as PTSD being admitted to the hospital with rectal bleeding found to have acute diverticulitis.  Patient states she was in her usual state of health until yesterday when she had sudden onset of right lower quadrant and back pain, and associated multiple bowel movements that were bright red blood.  Denies any nausea, vomiting, dizziness, fevers, diarrhea, denies ever having symptoms like this before.  Evaluation in the emergency department with CT scan shows no evidence of active bleeding, but evidence of acute diverticulitis.  She was given empiric IV antibiotics, and hospitalist contacted for admission.  Review of Systems: Please see HPI for pertinent positives and negatives. A complete 10 system review of systems are otherwise negative.  Past Medical History:  Diagnosis Date   Anxiety    in the past   Anxiety and depression    Arthritis    Chronic bilateral low back pain 07/10/2017   Depression    in the past.  Depression and anxiety starting in childhood. No counseling until 2015    PTSD (post-traumatic stress disorder) 07/10/2017   PTSD (post-traumatic stress disorder)    Syncope    Past Surgical History:  Procedure Laterality Date   BACK SURGERY     NECK SURGERY      Social History:  reports that she has been smoking cigarettes. She has a 17.5 pack-year smoking history. She has never used smokeless tobacco. She reports current drug use. Drug: Marijuana. She reports that she does not drink alcohol.   Allergies  Allergen Reactions   Naproxen Itching and Rash    Palpitations in high doses   Prednisone Palpitations   Tramadol Itching and Rash    Palpitations     Family History  Problem Relation Age of  Onset   Cancer Sister        Probably oral cancer   Cancer - Lung Neg Hx    Asthma Neg Hx    COPD Neg Hx      Prior to Admission medications   Medication Sig Start Date End Date Taking? Authorizing Provider  acetaminophen (TYLENOL) 500 MG tablet Take 500 mg by mouth every 6 (six) hours as needed for mild pain or headache. Patient not taking: Reported on 12/22/2021    [provider]  albuterol (PROVENTIL HFA;VENTOLIN HFA) 108 (90 Base) MCG/ACT inhaler Inhale 2 puffs into the lungs every 6 (six) hours as needed for wheezing or shortness of breath. Patient not taking: Reported on 04/13/2020 02/11/18   Julieanne Manson, MD  ARIPiprazole (ABILIFY) 10 MG tablet Take by mouth.    [provider]  ARIPiprazole (ABILIFY) 15 MG tablet Take 1 tablet (15 mg total) by mouth daily. 07/27/17   Julieanne Manson, MD  azithromycin (ZITHROMAX) 250 MG tablet 2 tabs by mouth today, then 1 tab by mouth daily for 4 more days Patient not taking: Reported on 04/13/2020 02/11/18   Julieanne Manson, MD  buPROP[facility]) 100 MG tablet Take by mouth.    [provider]  cephALEXin (KEFLEX) 500 MG capsule Take 1 capsule (500 mg total) by mouth 4 (four) times daily. Patient not taking: Reported on 02/11/2018 02/10/18   Bethel Born, PA-C  cetirizine (ZYRTEC) 10 MG tablet Take 1 tablet (10 mg total) by mouth  daily. Patient not taking: Reported on 04/13/2020 02/11/18   Julieanne Manson, MD  FLUoxetine (PROZAC) 10 MG capsule 1 capsule    [provider]  FLUoxetine (PROZAC) 20 MG tablet Take 1 tablet (20 mg total) by mouth daily. Patient not taking: Reported on 04/13/2020 07/27/17   Julieanne Manson, MD  HYDROcodone-acetaminophen (NORCO/VICODIN) 5-325 MG tablet Take 1 tablet by mouth every 6 (six) hours as needed for severe pain. Patient not taking: Reported on 04/13/2020 02/11/20   Antony Madura, PA-C  hydrocortisone cream 1 % Apply to affected area 2 times daily 05/22/22    Cecil Cobbs, PA-C  lidocaine (LIDODERM) 5 % Place 1 patch onto the skin daily. Remove & Discard patch within 12 hours or as directed by MD Patient not taking: Reported on 04/13/2020 02/11/20   Antony Madura, PA-C  methocarbamol (ROBAXIN) 500 MG tablet Take 1 tablet (500 mg total) by mouth every 12 (twelve) hours as needed for muscle spasms. Patient not taking: Reported on 04/13/2020 02/11/20   Antony Madura, PA-C  oxyCODONE-acetaminophen (PERCOCET/ROXICET) 5-325 MG tablet Take 1 tablet by mouth every 8 (eight) hours as needed for severe pain. Patient not taking: Reported on 12/22/2021 04/13/20   Dietrich Pates, PA-C  predniSONE (STERAPRED UNI-PAK 21 TAB) 10 MG (21) TBPK tablet Take by mouth daily. Take 6 tabs by mouth daily for 2 days, then 5 tabs for 2 days, then 4 tabs for 2 days, then 3 tabs for 2 days, 2 tabs for 2 days, then 1 tab by mouth daily for 2 days 05/22/22   Cecil Cobbs, PA-C  pregabalin (LYRICA) 100 MG capsule 2 caps by mouth 3 times daily Patient not taking: Reported on 04/13/2020 01/17/18   Julieanne Manson, MD  umeclidinium-vilanterol [facility] ELLIPTA) 62.5-25 MCG/ACT AEPB Inhale 1 puff into the lungs daily. 12/22/21   Charlott Holler, MD    Physical Exam: BP (!) 134/101 (BP Location: Left Arm)   Pulse 78   Temp 97.8 F (36.6 C) (Oral)   Resp 19   Ht 5\' 10"  (1.778 m)   Wt 99.8 kg   BMI 31.57 kg/m   General:  Alert, oriented, calm, in no acute distress but looks uncomfortable clutching her abdomen Cardiovascular: RRR, no murmurs or rubs, no peripheral edema  Respiratory: clear to auscultation bilaterally, no wheezes, no crackles  Abdomen: soft, diffusely tender, nondistended, voluntary guarding, normal bowel tones heard  Skin: dry, no rashes  Musculoskeletal: no joint effusions, normal range of motion  Psychiatric: appropriate affect, normal speech  Neurologic: extraocular muscles intact, clear speech, moving all extremities with intact sensorium         Labs on  Admission:  Basic Metabolic Panel: Recent Labs  Lab 09/25/23 0935  NA 139  K 3.9  CL 104  CO2 24  GLUCOSE 110*  BUN 8  CREATININE 0.81  CALCIUM 9.7   Liver Function Tests: Recent Labs  Lab 09/25/23 0935  AST 42*  ALT 51*  ALKPHOS 75  BILITOT 1.1  PROT 7.8  ALBUMIN 4.0   No results for input(s): "LIPASE", "AMYLASE" in the last 168 hours. No results for input(s): "AMMONIA" in the last 168 hours. CBC: Recent Labs  Lab 09/25/23 0935  WBC 5.9  NEUTROABS 3.8  HGB 16.0*  HCT 47.9*  MCV 100.0  PLT 177   Cardiac Enzymes: No results for input(s): "CKTOTAL", "CKMB", "CKMBINDEX", "TROPONINI" in the last 168 hours.  BNP (last 3 results) No results for input(s): "BNP" in the last 8760 hours.  ProBNP (last 3 results) No results for input(s): "PROBNP" in the last 8760 hours.  CBG: No results for input(s): "GLUCAP" in the last 168 hours.  Radiological Exams on Admission: CT ANGIO GI BLEED  Result Date: 09/25/2023 CLINICAL DATA:  57 year old female with report of rectal bleeding EXAM: CTA ABDOMEN AND PELVIS WITHOUT AND WITH CONTRAST TECHNIQUE: Multidetector CT imaging of the abdomen and pelvis was performed using the standard protocol during bolus administration of intravenous contrast. Multiplanar reconstructed images and MIPs were obtained and reviewed to evaluate the vascular anatomy. RADIATION DOSE REDUCTION: This exam was performed according to the departmental dose-optimization program which includes automated exposure control, adjustment of the mA and/or kV according to patient size and/or use of iterative reconstruction technique. CONTRAST:  OMNIPAQUE IOHEXOL 350 MG/ML SOLN COMPARISON:  Chest CT 03/29/2016 FINDINGS: VASCULAR Aorta: Unremarkable course, caliber, contour of the abdominal aorta. No dissection, aneurysm, or periaortic fluid. Mild atherosclerotic changes of the abdominal aorta. Celiac: Patent, with no significant atherosclerotic changes. SMA: Patent, with  no significant atherosclerotic changes. Renals: - Right: Right renal artery patent. - Left: Left renal artery patent. IMA: Inferior mesenteric artery is patent. Right lower extremity: Unremarkable course, caliber, and contour of the right iliac system. No aneurysm, dissection, or occlusion. Hypogastric artery is patent. Common femoral artery patent. Proximal SFA and profunda femoris patent. Left lower extremity: Unremarkable course, caliber, and contour of the left iliac system. No aneurysm, dissection, or occlusion. Hypogastric artery is patent. Common femoral artery patent. Proximal SFA and profunda femoris patent. Veins: Unremarkable appearance of the venous system. Review of the MIP images confirms the above findings. NON-VASCULAR Lower chest: Mixed pattern of mild ground-glass and reticular opacities and dependent lungs, new from the comparison CT of 2017. Mild bronchial wall thickening with no endoluminal debris. No pleural effusion. Hepatobiliary: Diffusely decreased attenuation of the liver parenchyma compatible with liver steatosis. Cranial caudal span of the liver estimated greater than 18 cm. Unremarkable gall bladder. Pancreas: Unremarkable. Spleen: Unremarkable. Adrenals/Urinary Tract: - Right adrenal gland: Unremarkable - Left adrenal gland: Unremarkable. - Right kidney: No hydronephrosis, nephrolithiasis, inflammation, or ureteral dilation. No focal lesion. - Left Kidney: No hydronephrosis, nephrolithiasis, inflammation, or ureteral dilation. No focal lesion. - Urinary Bladder: Unremarkable. Stomach/Bowel: - Stomach: Hiatal hernia.  Otherwise unremarkable stomach - Small bowel: Unremarkable - Appendix: Normal. - Colon: No significant stool burden. Circumferential colonic wall thickening of the descending colon with associated mild inflammatory edema within the adjacent fat. Diverticular present within descending colon and sigmoid colon. No evidence of abscess or perforation Lymphatic: No adenopathy.  Mesenteric: No free air or free fluid. Mild edematous changes of the fat adjacent to descending colon. Reproductive: Unremarkable appearance of the pelvic organs. Other: No hernia. Musculoskeletal: No evidence of acute fracture. No bony canal narrowing. No significant degenerative changes of the hips. IMPRESSION: CT angiogram is negative for evidence of acute GI hemorrhage. Left colitis, favored to be acute uncomplicated diverticulitis given the background of diverticula. Inflammatory colitis not excluded, and ischemic colitis is unlikely. Liver steatosis and hepatomegaly Changes in the lower lungs may reflect bronchitis and correlation with any pulmonary symptoms would be useful. Aortic Atherosclerosis (ICD10-I70.0). Signed, Yvone Neu. Miachel Roux, RPVI [facility] Electronically Signed   By: Gilmer Mor D.O.   On: 09/25/2023 11:43    Assessment/Plan Kaliyan Hoppe is a 57 y.o. female with medical history significant for anxiety and depression, as well as PTSD being admitted to the hospital with rectal bleeding  found to have acute diverticulitis.   Acute diverticulitis-with abdominal pain, but no systemic evidence of sepsis.  Differential includes inflammatory colitis or less likely ischemia given lack of infectious symptoms. -Observation admission -Regular diet -Pain and nausea control as needed -Empiric IV Unasyn  Lower GI bleeding-due to acute diverticulitis, not anemic, avoid blood thinners -Trend hemoglobin with morning labs  Depression and anxiety-will resume home medications once reconciled  DVT prophylaxis: SCDs only    Code Status: Full Code  Consults called: None  Admission status: Observation  Time spent: 49 minutes  Ceciley Buist Sharlette Dense MD [facility] Pager [phone]  If 7PM-7AM, please contact night-coverage [email or web address] Password [email or web address]  09/25/2023, 1:23 PM

## 2023-09-25 NOTE — ED Triage Notes (Signed)
Pt arrived reporting bleeding from rectum. States she was having stools but that has stop. Endorses bright red blood with abdominal pain with vomiting. States this has never happened. No other symptoms

## 2023-09-25 NOTE — ED Provider Notes (Signed)
[place] EMERGENCY DEPARTMENT AT [facility] Provider Note   CSN: [id number] Arrival date & time: 09/25/23  4098     History  Chief Complaint  Patient presents with   Abdominal Pain    Crystal Baker is a 57 y.o. female.  57 year old female presents with acute onset of GI bleeding which began today.  Patient states that she has no prior history of same.  Notes that she has not had any nausea or vomiting.  Describes that she had bright red blood per rectum which began today.  Notes copious amounts.  No prior history of same.  Does have a history of colonic polyps.  Denies any history of hemorrhoids.  Symptoms did begin with some abdominal cramping but denies any abdominal pain currently at this time.  Does not take any blood thinners.       Home Medications Prior to Admission medications   Medication Sig Start Date End Date Taking? Authorizing Provider  acetaminophen (TYLENOL) 500 MG tablet Take 500 mg by mouth every 6 (six) hours as needed for mild pain or headache. Patient not taking: Reported on 12/22/2021    [provider]  albuterol (PROVENTIL HFA;VENTOLIN HFA) 108 (90 Base) MCG/ACT inhaler Inhale 2 puffs into the lungs every 6 (six) hours as needed for wheezing or shortness of breath. Patient not taking: Reported on 04/13/2020 02/11/18   Julieanne Manson, MD  ARIPiprazole (ABILIFY) 10 MG tablet Take by mouth.    [provider]  ARIPiprazole (ABILIFY) 15 MG tablet Take 1 tablet (15 mg total) by mouth daily. 07/27/17   Julieanne Manson, MD  azithromycin (ZITHROMAX) 250 MG tablet 2 tabs by mouth today, then 1 tab by mouth daily for 4 more days Patient not taking: Reported on 04/13/2020 02/11/18   Julieanne Manson, MD  buPROPion [facility]) 100 MG tablet Take by mouth.    [provider]  cephALEXin (KEFLEX) 500 MG capsule Take 1 capsule (500 mg total) by mouth 4 (four) times daily. Patient not taking: Reported on 02/11/2018  02/10/18   Bethel Born, PA-C  cetirizine (ZYRTEC) 10 MG tablet Take 1 tablet (10 mg total) by mouth daily. Patient not taking: Reported on 04/13/2020 02/11/18   Julieanne Manson, MD  FLUoxetine (PROZAC) 10 MG capsule 1 capsule    [provider]  FLUoxetine (PROZAC) 20 MG tablet Take 1 tablet (20 mg total) by mouth daily. Patient not taking: Reported on 04/13/2020 07/27/17   Julieanne Manson, MD  HYDROcodone-acetaminophen (NORCO/VICODIN) 5-325 MG tablet Take 1 tablet by mouth every 6 (six) hours as needed for severe pain. Patient not taking: Reported on 04/13/2020 02/11/20   Antony Madura, PA-C  hydrocortisone cream 1 % Apply to affected area 2 times daily 05/22/22   Cecil Cobbs, PA-C  lidocaine (LIDODERM) 5 % Place 1 patch onto the skin daily. Remove & Discard patch within 12 hours or as directed by MD Patient not taking: Reported on 04/13/2020 02/11/20   Antony Madura, PA-C  methocarbamol (ROBAXIN) 500 MG tablet Take 1 tablet (500 mg total) by mouth every 12 (twelve) hours as needed for muscle spasms. Patient not taking: Reported on 04/13/2020 02/11/20   Antony Madura, PA-C  oxyCODONE-acetaminophen (PERCOCET/ROXICET) 5-325 MG tablet Take 1 tablet by mouth every 8 (eight) hours as needed for severe pain. Patient not taking: Reported on 12/22/2021 04/13/20   Dietrich Pates, PA-C  predniSONE (STERAPRED UNI-PAK 21 TAB) 10 MG (21) TBPK tablet Take by mouth daily. Take 6 tabs by mouth daily  for 2 days, then 5 tabs for 2 days, then 4 tabs for 2 days, then 3 tabs for 2 days, 2 tabs for 2 days, then 1 tab by mouth daily for 2 days 05/22/22   Cecil Cobbs, PA-C  pregabalin (LYRICA) 100 MG capsule 2 caps by mouth 3 times daily Patient not taking: Reported on 04/13/2020 01/17/18   Julieanne Manson, MD  umeclidinium-vilanterol [facility] ELLIPTA) 62.5-25 MCG/ACT AEPB Inhale 1 puff into the lungs daily. 12/22/21   Charlott Holler, MD      Allergies    Naproxen, Prednisone, and Tramadol     Review of Systems   Review of Systems  All other systems reviewed and are negative.   Physical Exam Updated Vital Signs BP (!) 134/101 (BP Location: Left Arm)   Pulse 78   Temp (!) 97.5 F (36.4 C) (Oral)   Resp 19   Ht 1.778 m (5\' 10" )   Wt 99.8 kg   BMI 31.57 kg/m  Physical Exam Vitals and nursing note reviewed. Exam conducted with a chaperone present.  Constitutional:      General: She is not in acute distress.    Appearance: Normal appearance. She is well-developed. She is not toxic-appearing.  HENT:     Head: Normocephalic and atraumatic.  Eyes:     General: Lids are normal.     Conjunctiva/sclera: Conjunctivae normal.     Pupils: Pupils are equal, round, and reactive to light.  Neck:     Thyroid: No thyroid mass.     Trachea: No tracheal deviation.  Cardiovascular:     Rate and Rhythm: Normal rate and regular rhythm.     Heart sounds: Normal heart sounds. No murmur heard.    No gallop.  Pulmonary:     Effort: Pulmonary effort is normal. No respiratory distress.     Breath sounds: Normal breath sounds. No stridor. No decreased breath sounds, wheezing, rhonchi or rales.  Abdominal:     General: There is no distension.     Palpations: Abdomen is soft.     Tenderness: There is no abdominal tenderness. There is no rebound.  Genitourinary:    Comments: Blood on digital exam noted Musculoskeletal:        General: No tenderness. Normal range of motion.     Cervical back: Normal range of motion and neck supple.  Skin:    General: Skin is warm and dry.     Findings: No abrasion or rash.  Neurological:     Mental Status: She is alert and oriented to person, place, and time. Mental status is at baseline.     GCS: GCS eye subscore is 4. GCS verbal subscore is 5. GCS motor subscore is 6.     Cranial Nerves: Cranial nerves are intact. No cranial nerve deficit.     Sensory: No sensory deficit.     Motor: Motor function is intact.  Psychiatric:        Attention and  Perception: Attention normal.        Speech: Speech normal.        Behavior: Behavior normal.     ED Results / Procedures / Treatments   Labs (all labs ordered are listed, but only abnormal results are displayed) Labs Reviewed - No data to display  EKG None  Radiology No results found.  Procedures Procedures    Medications Ordered in ED Medications - No data to display  ED Course/ Medical Decision Making/ A&P  Medical Decision Making Amount and/or Complexity of Data Reviewed Labs: ordered. Radiology: ordered. ECG/medicine tests: ordered.  Risk Prescription drug management.   Patient presented due to having bright red blood per rectum.  Concern for possible acute GI hemorrhage.  Abdominal CT with angiography show patient have diverticulitis.  No obvious abscess.  Hemoglobin is stable here.  No indication for transfusion.  Patient given dose of IV Unasyn along with morphine.  Reassessed and continues to be uncomfortable.  Will redosed with additional morphine and patient to require admission to hospitalist        Final Clinical Impression(s) / ED Diagnoses Final diagnoses:  None    Rx / DC Orders ED Discharge Orders     None         Lorre Nick, MD 09/25/23 1301

## 2023-09-25 NOTE — Progress Notes (Signed)
Pharmacy Antibiotic Note  Crystal Baker is a 57 y.o. female admitted on 09/25/2023 with  IAI .  Pharmacy has been consulted for Unasyn dosing.  Plan: Unasyn 3 g IV q6h Monitor clinical progress, renal function F/U C&S, abx deescalation / LOT   Height: 5\' 10"  (177.8 cm) Weight: 99.8 kg (220 lb) IBW/kg (Calculated) : 68.5  Temp (24hrs), Avg:97.7 F (36.5 C), Min:97.5 F (36.4 C), Max:97.8 F (36.6 C)  Recent Labs  Lab 09/25/23 0935  WBC 5.9  CREATININE 0.81    Estimated Creatinine Clearance: 99.2 mL/min (by C-G formula based on SCr of 0.81 mg/dL).    Allergies  Allergen Reactions   Naproxen Itching and Rash    Palpitations in high doses   Prednisone Palpitations   Tramadol Itching and Rash    Palpitations     Antimicrobials this admission: 11/26 Unasyn >>     Thank you for allowing pharmacy to be a part of this patient's care.  Lynden Ang, PharmD, BCPS 09/25/2023 2:34 PM

## 2023-09-26 DIAGNOSIS — K5792 Diverticulitis of intestine, part unspecified, without perforation or abscess without bleeding: Principal | ICD-10-CM

## 2023-09-26 LAB — CBC
HCT: 46.8 % — ABNORMAL HIGH (ref 36.0–46.0)
Hemoglobin: 15.2 g/dL — ABNORMAL HIGH (ref 12.0–15.0)
MCH: 33.5 pg (ref 26.0–34.0)
MCHC: 32.5 g/dL (ref 30.0–36.0)
MCV: 103.1 fL — ABNORMAL HIGH (ref 80.0–100.0)
Platelets: 183 10*3/uL (ref 150–400)
RBC: 4.54 MIL/uL (ref 3.87–5.11)
RDW: 12 % (ref 11.5–15.5)
WBC: 6.6 10*3/uL (ref 4.0–10.5)
nRBC: 0 % (ref 0.0–0.2)

## 2023-09-26 LAB — BASIC METABOLIC PANEL
Anion gap: 9 (ref 5–15)
BUN: 11 mg/dL (ref 6–20)
CO2: 27 mmol/L (ref 22–32)
Calcium: 9.1 mg/dL (ref 8.9–10.3)
Chloride: 101 mmol/L (ref 98–111)
Creatinine, Ser: 0.94 mg/dL (ref 0.44–1.00)
GFR, Estimated: >60 mL/min (ref >60)
Glucose, Bld: 110 mg/dL — ABNORMAL HIGH (ref 70–99)
Potassium: 3.9 mmol/L (ref 3.5–5.1)
Sodium: 137 mmol/L (ref 135–145)

## 2023-09-26 MED ORDER — DICYCLOMINE HCL 10 MG PO CAPS
10.0000 mg | ORAL_CAPSULE | Freq: Three times a day (TID) | ORAL | Status: DC
  Administered 2023-09-26 – 2023-09-27 (×3): 10 mg via ORAL
  Filled 2023-09-26 (×3): qty 1

## 2023-09-26 NOTE — Plan of Care (Signed)
  Problem: Education: Goal: Knowledge of General Education information will improve Description: Including pain rating scale, medication(s)/side effects and non-pharmacologic comfort measures Outcome: Progressing   Problem: Nutrition: Goal: Adequate nutrition will be maintained Outcome: Progressing   Problem: Coping: Goal: Level of anxiety will decrease Outcome: Progressing   Problem: Elimination: Goal: Will not experience complications related to bowel motility Outcome: Progressing   Problem: Pain Management: Goal: General experience of comfort will improve Outcome: Progressing   Problem: Skin Integrity: Goal: Risk for impaired skin integrity will decrease Outcome: Progressing   Problem: Safety: Goal: Ability to remain free from injury will improve Outcome: Progressing

## 2023-09-26 NOTE — Progress Notes (Addendum)
PROGRESS NOTE    Crystal Baker  OZD:[id number] DOB: 07-10-1966 DOA: 09/25/2023 PCP: Julieanne Manson, MD   Brief Narrative: 57 y.o. female with medical history significant for anxiety and depression, as well as PTSD being admitted to the hospital with rectal bleeding found to have acute diverticulitis.  Patient states she was in her usual state of health until yesterday when she had sudden onset of right lower quadrant and back pain, and associated multiple bowel movements that were bright red blood.  Denies any nausea, vomiting, dizziness, fevers, diarrhea, denies ever having symptoms like this before.  Evaluation in the emergency department with CT scan shows no evidence of active bleeding, but evidence of acute diverticulitis.  She was given empiric IV antibiotics, and hospitalist contacted for admission.   Assessment & Plan:   Principal Problem:   Diverticulitis large intestine   Acute diverticulitis-patient admitted with abdominal pain and nausea.  CT abdomen shows diverticulosis with diverticulitis.  She denies any melena complains of nausea unable to keep anything down feels dizzy walking.  We tried to upgrade her diet today but she was not able to tolerate.  Continue IV Unasyn and symptomatic treatments with antinausea and Bentyl.    Lower GI bleeding-due to acute diverticulitis, not anemic, avoid blood thinners -Trend hemoglobin with morning labs   Depression and anxiety-not on any medications at home  Estimated body mass index is 29.58 kg/m as calculated from the following:   Height as of this encounter: 5\' 10"  (1.778 m).   Weight as of this encounter: 93.5 kg.  DVT prophylaxis:scd Code Status: full Family Communication: Disposition Plan:  Status is: Inpatient Remains inpatient appropriate because: Abdominal pain diverticulitis unable to keep p.o. down   Consultants:  None  Procedures: None Antimicrobials: Anti-infectives (From admission, onward)    Start      Dose/Rate Route Frequency Ordered Stop   09/25/23 1800  Ampicillin-Sulbactam (UNASYN) 3 g in sodium chloride 0.9 % 100 mL IVPB        3 g 200 mL/hr over 30 Minutes Intravenous Every 6 hours 09/25/23 1338     09/25/23 1200  Ampicillin-Sulbactam (UNASYN) 3 g in sodium chloride 0.9 % 100 mL IVPB        3 g 200 mL/hr over 30 Minutes Intravenous  Once 09/25/23 1158 09/25/23 1309       Subjective: Patient is resting in bed still having a lot of abdominal pain and cramps not able to eat complains of nausea  Objective: Vitals:   09/25/23 1822 09/25/23 1941 09/26/23 0503 09/26/23 1406  BP: 124/81 115/77 (!) 142/94 139/81  Pulse: 65 64 70 64  Resp: 16 18 13    Temp: 98.4 F (36.9 C) 98 F (36.7 C) (!) 97.4 F (36.3 C) 98.2 F (36.8 C)  TempSrc: Oral Oral Oral Oral  SpO2: 96% 96% 95% 96%  Weight:      Height:        Intake/Output Summary (Last 24 hours) at 09/26/2023 1501 Last data filed at 09/26/2023 0517 Gross per 24 hour  Intake 1028.71 ml  Output --  Net 1028.71 ml   Filed Weights   09/25/23 0846 09/25/23 1454  Weight: 99.8 kg 93.5 kg    Examination:  General exam: Appears in mild distress Respiratory system: Clear to auscultation. Respiratory effort normal. Cardiovascular system: S1 & S2 heard, RRR. No JVD, murmurs, rubs, gallops or clicks. No pedal edema. Gastrointestinal system: Abdomen is nondistended, soft and tender. No organomegaly or masses felt. Normal bowel sounds  heard. Central nervous system: Alert and oriented. No focal neurological deficits. Extremities: no edema  Data Reviewed: I have personally reviewed following labs and imaging studies  CBC: Recent Labs  Lab 09/25/23 0935 09/26/23 0536  WBC 5.9 6.6  NEUTROABS 3.8  --   HGB 16.0* 15.2*  HCT 47.9* 46.8*  MCV 100.0 103.1*  PLT 177 183   Basic Metabolic Panel: Recent Labs  Lab 09/25/23 0935 09/26/23 0536  NA 139 137  K 3.9 3.9  CL 104 101  CO2 24 27  GLUCOSE 110* 110*  BUN 8 11   CREATININE 0.81 0.94  CALCIUM 9.7 9.1   GFR: Estimated Creatinine Clearance: 82.8 mL/min (by C-G formula based on SCr of 0.94 mg/dL). Liver Function Tests: Recent Labs  Lab 09/25/23 0935  AST 42*  ALT 51*  ALKPHOS 75  BILITOT 1.1  PROT 7.8  ALBUMIN 4.0   No results for input(s): "LIPASE", "AMYLASE" in the last 168 hours. No results for input(s): "AMMONIA" in the last 168 hours. Coagulation Profile: No results for input(s): "INR", "PROTIME" in the last 168 hours. Cardiac Enzymes: No results for input(s): "CKTOTAL", "CKMB", "CKMBINDEX", "TROPONINI" in the last 168 hours. BNP (last 3 results) No results for input(s): "PROBNP" in the last 8760 hours. HbA1C: No results for input(s): "HGBA1C" in the last 72 hours. CBG: No results for input(s): "GLUCAP" in the last 168 hours. Lipid Profile: No results for input(s): "CHOL", "HDL", "LDLCALC", "TRIG", "CHOLHDL", "LDLDIRECT" in the last 72 hours. Thyroid Function Tests: No results for input(s): "TSH", "T4TOTAL", "FREET4", "T3FREE", "THYROIDAB" in the last 72 hours. Anemia Panel: No results for input(s): "VITAMINB12", "FOLATE", "FERRITIN", "TIBC", "IRON", "RETICCTPCT" in the last 72 hours. Sepsis Labs: No results for input(s): "PROCALCITON", "LATICACIDVEN" in the last 168 hours.  No results found for this or any previous visit (from the past 240 hour(s)).       Radiology Studies: CT ANGIO GI BLEED  Result Date: 09/25/2023 CLINICAL DATA:  57 year old female with report of rectal bleeding EXAM: CTA ABDOMEN AND PELVIS WITHOUT AND WITH CONTRAST TECHNIQUE: Multidetector CT imaging of the abdomen and pelvis was performed using the standard protocol during bolus administration of intravenous contrast. Multiplanar reconstructed images and MIPs were obtained and reviewed to evaluate the vascular anatomy. RADIATION DOSE REDUCTION: This exam was performed according to the departmental dose-optimization program which includes automated  exposure control, adjustment of the mA and/or kV according to patient size and/or use of iterative reconstruction technique. CONTRAST:  OMNIPAQUE IOHEXOL 350 MG/ML SOLN COMPARISON:  Chest CT 03/29/2016 FINDINGS: VASCULAR Aorta: Unremarkable course, caliber, contour of the abdominal aorta. No dissection, aneurysm, or periaortic fluid. Mild atherosclerotic changes of the abdominal aorta. Celiac: Patent, with no significant atherosclerotic changes. SMA: Patent, with no significant atherosclerotic changes. Renals: - Right: Right renal artery patent. - Left: Left renal artery patent. IMA: Inferior mesenteric artery is patent. Right lower extremity: Unremarkable course, caliber, and contour of the right iliac system. No aneurysm, dissection, or occlusion. Hypogastric artery is patent. Common femoral artery patent. Proximal SFA and profunda femoris patent. Left lower extremity: Unremarkable course, caliber, and contour of the left iliac system. No aneurysm, dissection, or occlusion. Hypogastric artery is patent. Common femoral artery patent. Proximal SFA and profunda femoris patent. Veins: Unremarkable appearance of the venous system. Review of the MIP images confirms the above findings. NON-VASCULAR Lower chest: Mixed pattern of mild ground-glass and reticular opacities and dependent lungs, new from the comparison CT of 2017. Mild bronchial wall thickening with no  endoluminal debris. No pleural effusion. Hepatobiliary: Diffusely decreased attenuation of the liver parenchyma compatible with liver steatosis. Cranial caudal span of the liver estimated greater than 18 cm. Unremarkable gall bladder. Pancreas: Unremarkable. Spleen: Unremarkable. Adrenals/Urinary Tract: - Right adrenal gland: Unremarkable - Left adrenal gland: Unremarkable. - Right kidney: No hydronephrosis, nephrolithiasis, inflammation, or ureteral dilation. No focal lesion. - Left Kidney: No hydronephrosis, nephrolithiasis, inflammation, or ureteral  dilation. No focal lesion. - Urinary Bladder: Unremarkable. Stomach/Bowel: - Stomach: Hiatal hernia.  Otherwise unremarkable stomach - Small bowel: Unremarkable - Appendix: Normal. - Colon: No significant stool burden. Circumferential colonic wall thickening of the descending colon with associated mild inflammatory edema within the adjacent fat. Diverticular present within descending colon and sigmoid colon. No evidence of abscess or perforation Lymphatic: No adenopathy. Mesenteric: No free air or free fluid. Mild edematous changes of the fat adjacent to descending colon. Reproductive: Unremarkable appearance of the pelvic organs. Other: No hernia. Musculoskeletal: No evidence of acute fracture. No bony canal narrowing. No significant degenerative changes of the hips. IMPRESSION: CT angiogram is negative for evidence of acute GI hemorrhage. Left colitis, favored to be acute uncomplicated diverticulitis given the background of diverticula. Inflammatory colitis not excluded, and ischemic colitis is unlikely. Liver steatosis and hepatomegaly Changes in the lower lungs may reflect bronchitis and correlation with any pulmonary symptoms would be useful. Aortic Atherosclerosis (ICD10-I70.0). Signed, Yvone Neu. Miachel Roux, RPVI [facility] Radiology Electronically Signed   By: Gilmer Mor D.O.   On: 09/25/2023 11:43        Scheduled Meds:  dicyclomine  10 mg Oral TID AC   Continuous Infusions:  ampicillin-sulbactam (UNASYN) IV 3 g (09/26/23 1216)     LOS: 0 days    Time spent: 38 min  Alwyn Ren, MD  09/26/2023, 3:01 PM

## 2023-09-26 NOTE — Progress Notes (Signed)
   09/26/23 1318  TOC Brief Assessment  Insurance and Status Reviewed  Patient has primary care physician Yes  Home environment has been reviewed Apartment  Prior level of function: Independent  Prior/Current Home Services No current home services  Social Determinants of Health Reivew SDOH reviewed no interventions necessary  Readmission risk has been reviewed Yes  Transition of care needs no transition of care needs at this time

## 2023-09-27 MED ORDER — ONDANSETRON HCL 4 MG PO TABS: 4.0000 mg | ORAL_TABLET | Freq: Four times a day (QID) | ORAL | 0 refills | Status: DC | PRN

## 2023-09-27 MED ORDER — DICYCLOMINE HCL 10 MG PO CAPS: 10.0000 mg | ORAL_CAPSULE | Freq: Three times a day (TID) | ORAL | 0 refills | Status: DC

## 2023-09-27 MED ORDER — AMOXICILLIN-POT CLAVULANATE 500-125 MG PO TABS: 1.0000 | ORAL_TABLET | Freq: Three times a day (TID) | ORAL | 0 refills | Status: DC

## 2023-09-27 MED ORDER — OXYCODONE HCL 5 MG PO TABS: 5.0000 mg | ORAL_TABLET | ORAL | 0 refills | Status: DC | PRN

## 2023-09-27 MED ORDER — AMOXICILLIN-POT CLAVULANATE 875-125 MG PO TABS: 1.0000 | ORAL_TABLET | Freq: Two times a day (BID) | ORAL | 0 refills | Status: DC

## 2023-09-27 MED ORDER — AMOXICILLIN-POT CLAVULANATE 875-125 MG PO TABS
1.0000 | ORAL_TABLET | Freq: Two times a day (BID) | ORAL | Status: DC
  Administered 2023-09-27: 1 via ORAL
  Filled 2023-09-27: qty 1

## 2023-09-27 NOTE — Plan of Care (Signed)
  Problem: Education: Goal: Knowledge of General Education information will improve Description Including pain rating scale, medication(s)/side effects and non-pharmacologic comfort measures Outcome: Progressing   Problem: Health Behavior/Discharge Planning: Goal: Ability to manage health-related needs will improve Outcome: Progressing   

## 2023-09-27 NOTE — Discharge Summary (Signed)
Physician Discharge Summary  Winner Bergland ZOX:[id number] DOB: Mar 13, 1966 DOA: 09/25/2023  PCP: Julieanne Manson, MD  Admit date: 09/25/2023 Discharge date: 09/27/2023  Admitted From: Home Disposition: Home  Recommendations for Outpatient Follow-up:  Follow up with PCP in 1-2 weeks Please obtain BMP/CBC in one week Please follow up with GI  Home Health: None Equipment/Devices: None   Discharge Condition: Stable CODE STATUS: Full code Diet recommendation: Cardiac   brief/Interim Summary: 57 y.o. female with medical history significant for anxiety and depression, as well as PTSD being admitted to the hospital with rectal bleeding found to have acute diverticulitis.  Patient states she was in her usual state of health until yesterday when she had sudden onset of right lower quadrant and back pain, and associated multiple bowel movements that were bright red blood.  Denies any nausea, vomiting, dizziness, fevers, diarrhea, denies ever having symptoms like this before.  Evaluation in the emergency department with CT scan shows no evidence of active bleeding, but evidence of acute diverticulitis.  She was given empiric IV antibiotics, and hospitalist contacted for admission.  Discharge Diagnoses:  Principal Problem:   Diverticulitis large intestine Active Problems:   Diverticulitis  Acute diverticulitis-patient admitted with abdominal pain and nausea.  CT abdomen shows diverticulosis with diverticulitis.  She was treated with Unasyn IV fluids antinausea agents and Bentyl with improvement in her symptoms.  She was discharged on Augmentin for 7 days.  He was asked to follow-up with her GI physician who she could not remember who it was who did a colonoscopy on her a year ago and had polypectomy.    Lower GI bleeding-due to acute diverticulitis her hemoglobin was 15.2 on the day of discharge.  She did not require any blood transfusion.    Depression and anxiety-not on any medications  at home    Estimated body mass index is 29.58 kg/m as calculated from the following:   Height as of this encounter: 5\' 10"  (1.778 m).   Weight as of this encounter: 93.5 kg.  Discharge Instructions  Discharge Instructions     Diet - low sodium heart healthy   Complete by: As directed    Diet - low sodium heart healthy   Complete by: As directed    Increase activity slowly   Complete by: As directed    Increase activity slowly   Complete by: As directed       Allergies as of 09/27/2023       Reactions   Naproxen Itching, Rash   Palpitations in high doses   Prednisone Palpitations   Tramadol Itching, Rash   Palpitations         Medication List     STOP taking these medications    acetaminophen 500 MG tablet Commonly known as: TYLENOL       TAKE these medications    amoxicillin-clavulanate 875-125 MG tablet Commonly known as: AUGMENTIN Take 1 tablet by mouth 2 (two) times daily.   dicyclomine 10 MG capsule Commonly known as: BENTYL Take 1 capsule (10 mg total) by mouth 3 (three) times daily before meals.   ondansetron 4 MG tablet Commonly known as: ZOFRAN Take 1 tablet (4 mg total) by mouth every 6 (six) hours as needed for nausea.   oxyCODONE 5 MG immediate release tablet Commonly known as: Oxy IR/ROXICODONE Take 1 tablet (5 mg total) by mouth every 4 (four) hours as needed for moderate pain (pain score 4-6).        Allergies  Allergen Reactions  Naproxen Itching and Rash    Palpitations in high doses   Prednisone Palpitations   Tramadol Itching and Rash    Palpitations     Consultations: None   Procedures/Studies: CT ANGIO GI BLEED  Result Date: 09/25/2023 CLINICAL DATA:  57 year old female with report of rectal bleeding EXAM: CTA ABDOMEN AND PELVIS WITHOUT AND WITH CONTRAST TECHNIQUE: Multidetector CT imaging of the abdomen and pelvis was performed using the standard protocol during bolus administration of intravenous contrast.  Multiplanar reconstructed images and MIPs were obtained and reviewed to evaluate the vascular anatomy. RADIATION DOSE REDUCTION: This exam was performed according to the departmental dose-optimization program which includes automated exposure control, adjustment of the mA and/or kV according to patient size and/or use of iterative reconstruction technique. CONTRAST:  OMNIPAQUE IOHEXOL 350 MG/ML SOLN COMPARISON:  Chest CT 03/29/2016 FINDINGS: VASCULAR Aorta: Unremarkable course, caliber, contour of the abdominal aorta. No dissection, aneurysm, or periaortic fluid. Mild atherosclerotic changes of the abdominal aorta. Celiac: Patent, with no significant atherosclerotic changes. SMA: Patent, with no significant atherosclerotic changes. Renals: - Right: Right renal artery patent. - Left: Left renal artery patent. IMA: Inferior mesenteric artery is patent. Right lower extremity: Unremarkable course, caliber, and contour of the right iliac system. No aneurysm, dissection, or occlusion. Hypogastric artery is patent. Common femoral artery patent. Proximal SFA and profunda femoris patent. Left lower extremity: Unremarkable course, caliber, and contour of the left iliac system. No aneurysm, dissection, or occlusion. Hypogastric artery is patent. Common femoral artery patent. Proximal SFA and profunda femoris patent. Veins: Unremarkable appearance of the venous system. Review of the MIP images confirms the above findings. NON-VASCULAR Lower chest: Mixed pattern of mild ground-glass and reticular opacities and dependent lungs, new from the comparison CT of 2017. Mild bronchial wall thickening with no endoluminal debris. No pleural effusion. Hepatobiliary: Diffusely decreased attenuation of the liver parenchyma compatible with liver steatosis. Cranial caudal span of the liver estimated greater than 18 cm. Unremarkable gall bladder. Pancreas: Unremarkable. Spleen: Unremarkable. Adrenals/Urinary Tract: - Right adrenal gland:  Unremarkable - Left adrenal gland: Unremarkable. - Right kidney: No hydronephrosis, nephrolithiasis, inflammation, or ureteral dilation. No focal lesion. - Left Kidney: No hydronephrosis, nephrolithiasis, inflammation, or ureteral dilation. No focal lesion. - Urinary Bladder: Unremarkable. Stomach/Bowel: - Stomach: Hiatal hernia.  Otherwise unremarkable stomach - Small bowel: Unremarkable - Appendix: Normal. - Colon: No significant stool burden. Circumferential colonic wall thickening of the descending colon with associated mild inflammatory edema within the adjacent fat. Diverticular present within descending colon and sigmoid colon. No evidence of abscess or perforation Lymphatic: No adenopathy. Mesenteric: No free air or free fluid. Mild edematous changes of the fat adjacent to descending colon. Reproductive: Unremarkable appearance of the pelvic organs. Other: No hernia. Musculoskeletal: No evidence of acute fracture. No bony canal narrowing. No significant degenerative changes of the hips. IMPRESSION: CT angiogram is negative for evidence of acute GI hemorrhage. Left colitis, favored to be acute uncomplicated diverticulitis given the background of diverticula. Inflammatory colitis not excluded, and ischemic colitis is unlikely. Liver steatosis and hepatomegaly Changes in the lower lungs may reflect bronchitis and correlation with any pulmonary symptoms would be useful. Aortic Atherosclerosis (ICD10-I70.0). Signed, Yvone Neu. Miachel Roux, RPVI [facility] Electronically Signed   By: Gilmer Mor D.O.   On: 09/25/2023 11:43   (Echo, Carotid, EGD, Colonoscopy, ERCP)    Subjective: She is anxious to go home today abdominal pain is better tolerated diet  Discharge Exam: Vitals:  09/27/23 0537 09/27/23 1315  BP: 114/74 (!) 118/110  Pulse: 68 76  Resp: 16 16  Temp: 97.7 F (36.5 C) 98.7 F (37.1 C)  SpO2: 95% 95%   Vitals:   09/26/23  1406 09/26/23 1936 09/27/23 0537 09/27/23 1315  BP: 139/81 113/86 114/74 (!) 118/110  Pulse: 64 69 68 76  Resp:  16 16 16   Temp: 98.2 F (36.8 C) 98.3 F (36.8 C) 97.7 F (36.5 C) 98.7 F (37.1 C)  TempSrc: Oral Oral Oral Oral  SpO2: 96% 96% 95% 95%  Weight:      Height:        General: Pt is alert, awake, not in acute distress Cardiovascular: RRR, S1/S2 +, no rubs, no gallops Respiratory: CTA bilaterally, no wheezing, no rhonchi Abdominal: Soft, NT, ND, bowel sounds + Extremities: no edema, no cyanosis    The results of significant diagnostics from this hospitalization (including imaging, microbiology, ancillary and laboratory) are listed below for reference.     Microbiology: No results found for this or any previous visit (from the past 240 hour(s)).   Labs: BNP (last 3 results) No results for input(s): "BNP" in the last 8760 hours. Basic Metabolic Panel: Recent Labs  Lab 09/25/23 0935 09/26/23 0536  NA 139 137  K 3.9 3.9  CL 104 101  CO2 24 27  GLUCOSE 110* 110*  BUN 8 11  CREATININE 0.81 0.94  CALCIUM 9.7 9.1   Liver Function Tests: Recent Labs  Lab 09/25/23 0935  AST 42*  ALT 51*  ALKPHOS 75  BILITOT 1.1  PROT 7.8  ALBUMIN 4.0   No results for input(s): "LIPASE", "AMYLASE" in the last 168 hours. No results for input(s): "AMMONIA" in the last 168 hours. CBC: Recent Labs  Lab 09/25/23 0935 09/26/23 0536  WBC 5.9 6.6  NEUTROABS 3.8  --   HGB 16.0* 15.2*  HCT 47.9* 46.8*  MCV 100.0 103.1*  PLT 177 183   Cardiac Enzymes: No results for input(s): "CKTOTAL", "CKMB", "CKMBINDEX", "TROPONINI" in the last 168 hours. BNP: Invalid input(s): "POCBNP" CBG: No results for input(s): "GLUCAP" in the last 168 hours. D-Dimer No results for input(s): "DDIMER" in the last 72 hours. Hgb A1c No results for input(s): "HGBA1C" in the last 72 hours. Lipid Profile No results for input(s): "CHOL", "HDL", "LDLCALC", "TRIG", "CHOLHDL", "LDLDIRECT" in the last  72 hours. Thyroid function studies No results for input(s): "TSH", "T4TOTAL", "T3FREE", "THYROIDAB" in the last 72 hours.  Invalid input(s): "FREET3" Anemia work up No results for input(s): "VITAMINB12", "FOLATE", "FERRITIN", "TIBC", "IRON", "RETICCTPCT" in the last 72 hours. Urinalysis    Component Value Date/Time   COLORURINE YELLOW 02/10/2018 1614   APPEARANCEUR HAZY (A) 02/10/2018 1614   LABSPEC 1.018 02/10/2018 1614   PHURINE 5.0 02/10/2018 1614   GLUCOSEU NEGATIVE 02/10/2018 1614   HGBUR SMALL (A) 02/10/2018 1614   BILIRUBINUR NEGATIVE 02/10/2018 1614   KETONESUR NEGATIVE 02/10/2018 1614   PROTEINUR NEGATIVE 02/10/2018 1614   NITRITE NEGATIVE 02/10/2018 1614   LEUKOCYTESUR LARGE (A) 02/10/2018 1614   Sepsis Labs Recent Labs  Lab 09/25/23 0935 09/26/23 0536  WBC 5.9 6.6   Microbiology No results found for this or any previous visit (from the past 240 hour(s)).   Time coordinating discharge: 38  minutes  SIGNED:  Alwyn Ren, MD  [facility] 09/27/2023, 4:22 PM

## 2023-10-25 ENCOUNTER — Encounter (HOSPITAL_COMMUNITY): Payer: Self-pay

## 2023-10-25 ENCOUNTER — Emergency Department (HOSPITAL_COMMUNITY)
Admission: EM | Admit: 2023-10-25 | Discharge: 2023-10-25 | Disposition: A | Discharge status: Home / Self Care | Payer: Self-pay | Attending: Emergency Medicine | Admitting: Emergency Medicine

## 2023-10-25 ENCOUNTER — Emergency Department (HOSPITAL_COMMUNITY): Payer: Self-pay

## 2023-10-25 DIAGNOSIS — K625 Hemorrhage of anus and rectum: Secondary | ICD-10-CM | POA: Insufficient documentation

## 2023-10-25 DIAGNOSIS — R1030 Lower abdominal pain, unspecified: Secondary | ICD-10-CM

## 2023-10-25 DIAGNOSIS — K921 Melena: Secondary | ICD-10-CM

## 2023-10-25 LAB — URINALYSIS, ROUTINE W REFLEX MICROSCOPIC
Bacteria, UA: NONE SEEN
Bilirubin Urine: NEGATIVE
Glucose, UA: NEGATIVE mg/dL
Hgb urine dipstick: NEGATIVE
Ketones, ur: NEGATIVE mg/dL
Nitrite: NEGATIVE
Protein, ur: NEGATIVE mg/dL
Specific Gravity, Urine: 1.024 (ref 1.005–1.030)
pH: 6 (ref 5.0–8.0)

## 2023-10-25 LAB — CBC
HCT: 43.2 % (ref 36.0–46.0)
Hemoglobin: 14.9 g/dL (ref 12.0–15.0)
MCH: 33.9 pg (ref 26.0–34.0)
MCHC: 34.5 g/dL (ref 30.0–36.0)
MCV: 98.2 fL (ref 80.0–100.0)
Platelets: 174 10*3/uL (ref 150–400)
RBC: 4.4 MIL/uL (ref 3.87–5.11)
RDW: 11.9 % (ref 11.5–15.5)
WBC: 6.9 10*3/uL (ref 4.0–10.5)
nRBC: 0 % (ref 0.0–0.2)

## 2023-10-25 LAB — COMPREHENSIVE METABOLIC PANEL
ALT: 55 U/L — ABNORMAL HIGH (ref 0–44)
AST: 66 U/L — ABNORMAL HIGH (ref 15–41)
Albumin: 3.9 g/dL (ref 3.5–5.0)
Alkaline Phosphatase: 95 U/L (ref 38–126)
Anion gap: 10 (ref 5–15)
BUN: 13 mg/dL (ref 6–20)
CO2: 25 mmol/L (ref 22–32)
Calcium: 9.6 mg/dL (ref 8.9–10.3)
Chloride: 102 mmol/L (ref 98–111)
Creatinine, Ser: 0.82 mg/dL (ref 0.44–1.00)
GFR, Estimated: >60 mL/min (ref >60)
Glucose, Bld: 117 mg/dL — ABNORMAL HIGH (ref 70–99)
Potassium: 4.1 mmol/L (ref 3.5–5.1)
Sodium: 137 mmol/L (ref 135–145)
Total Bilirubin: 0.6 mg/dL (ref <1.2)
Total Protein: 7.4 g/dL (ref 6.5–8.1)

## 2023-10-25 LAB — LIPASE, BLOOD: Lipase: 36 U/L (ref 11–51)

## 2023-10-25 LAB — POC OCCULT BLOOD, ED: Fecal Occult Bld: NEGATIVE

## 2023-10-25 MED ORDER — ACETAMINOPHEN 500 MG PO TABS
1000.0000 mg | ORAL_TABLET | Freq: Once | ORAL | Status: AC
  Administered 2023-10-25: 1000 mg via ORAL
  Filled 2023-10-25: qty 2

## 2023-10-25 MED ORDER — IOHEXOL 300 MG/ML  SOLN
100.0000 mL | Freq: Once | INTRAMUSCULAR | Status: AC | PRN
  Administered 2023-10-25: 100 mL via INTRAVENOUS

## 2023-10-25 NOTE — Discharge Instructions (Signed)
Thank you for coming to [facility] Emergency Department. You were seen for abdominal pain and rectal bleeding. We did an exam, labs, and imaging, and these showed no acute findings. You had mild increase in your liver enzymes which will need to be rechecked. Please call to schedule an appointment with gastroenterology sometime in the next 1-2 weeks. You can take tylenol for pain at home.  Please follow up with your primary care provider within 1 week.   Do not hesitate to return to the ED or call 911 if you experience: -Worsening symptoms -Worsening rectal bleeding -Nausea/vomiting so severe that you cannot eat/drink anything -Lightheadedness, passing out -Fevers/chills -Anything else that concerns you

## 2023-10-25 NOTE — ED Provider Triage Note (Signed)
Emergency Medicine Provider Triage Evaluation Note  Crystal Baker , a 57 y.o. female  was evaluated in triage.  Pt complains of rectal bleeding, abdominal pain.  Reports all symptoms began this morning.  Endorsing left lower quadrant abdominal pain.  Reports history of diverticulitis.  Denies blood thinners.  States she is having excessive rectal hemorrhaging.  Denies fevers, nausea vomiting or diarrhea.  Review of Systems  Positive:  Negative:   Physical Exam  BP (!) 126/91 (BP Location: Left Arm)   Pulse 81   Temp 98.5 F (36.9 C) (Oral)   Resp 20   SpO2 97%  Gen:   Awake, no distress   Resp:  Normal effort  MSK:   Moves extremities without difficulty  Other:    Medical Decision Making  Medically screening exam initiated at 1:51 PM.  Appropriate orders placed.  Merci Skilling was informed that the remainder of the evaluation will be completed by another provider, this initial triage assessment does not replace that evaluation, and the importance of remaining in the ED until their evaluation is complete.     Al Decant, PA-C 10/25/23 1351

## 2023-10-25 NOTE — ED Triage Notes (Signed)
Pt arrived via POV, c/o rectal bleeding and diffuse abd pain. States hx of diverticulitis and feels similar to that.

## 2023-10-25 NOTE — ED Provider Notes (Signed)
[place] EMERGENCY DEPARTMENT AT [facility] Provider Note   CSN: [id number] Arrival date & time: 10/25/23  1328     History  Chief Complaint  Patient presents with   Rectal Bleeding   Abdominal Pain    Crystal Baker is a 57 y.o. female with PMH as listed below who presents with 7/10 constant lower abdominal pain and bright red rectal bleeding that started this morning. Felt similar when she had diverticulitis in November. Denies f/c, nausea/vomiting, urinary sxs, vaginal sxs, or flank pain. Has had frank bloody bowel movements 3 times today. Denies lightheadedness, SOB. No blood thinners.    Past Medical History:  Diagnosis Date   Anxiety    in the past   Anxiety and depression    Arthritis    Chronic bilateral low back pain 07/10/2017   Depression    in the past.  Depression and anxiety starting in childhood. No counseling until 2015    PTSD (post-traumatic stress disorder) 07/10/2017   PTSD (post-traumatic stress disorder)    Syncope        Home Medications Prior to Admission medications   Medication Sig Start Date End Date Taking? Authorizing Provider  amoxicillin-clavulanate (AUGMENTIN) 875-125 MG tablet Take 1 tablet by mouth 2 (two) times daily. 09/27/23   Alwyn Ren, MD  dicyclomine (BENTYL) 10 MG capsule Take 1 capsule (10 mg total) by mouth 3 (three) times daily before meals. 09/27/23   Alwyn Ren, MD  ondansetron (ZOFRAN) 4 MG tablet Take 1 tablet (4 mg total) by mouth every 6 (six) hours as needed for nausea. 09/27/23   Alwyn Ren, MD  oxyCODONE (OXY IR/ROXICODONE) 5 MG immediate release tablet Take 1 tablet (5 mg total) by mouth every 4 (four) hours as needed for moderate pain (pain score 4-6). 09/27/23   Alwyn Ren, MD      Allergies    Naproxen, Prednisone, and Tramadol    Review of Systems   Review of Systems A 10 point review of systems was performed and is negative unless otherwise reported  in HPI.  Physical Exam Updated Vital Signs BP (!) 151/77   Pulse 65   Temp 98.3 F (36.8 C) (Oral)   Resp 17   SpO2 97%  Physical Exam General: Uncomfortable appearing female, lying in bed.  HEENT: PERRLA, Sclera anicteric, MMM, trachea midline.  Cardiology: RRR, no murmurs/rubs/gallops. Resp: Normal respiratory rate and effort. CTAB, no wheezes, rhonchi, crackles.  Abd: Soft, non-tender, non-distended. No rebound tenderness or guarding.  Rectal: Unremarkable appearing external anal sphincter.  No external hemorrhoids or anal fissures noted.  Internal exam unremarkable with no masses palpated.  Small amount of brown stool on glove.  No gross blood. MSK: No peripheral edema or signs of trauma. Skin: warm, dry.  Back: No CVA tenderness Neuro: A&Ox4, CNs II-XII grossly intact. MAEs. Sensation grossly intact.  Psych: Normal mood and affect.   ED Results / Procedures / Treatments   Labs (all labs ordered are listed, but only abnormal results are displayed) Labs Reviewed  COMPREHENSIVE METABOLIC PANEL - Abnormal; Notable for the following components:      Result Value   Glucose, Bld 117 (*)    AST 66 (*)    ALT 55 (*)    All other components within normal limits  URINALYSIS, ROUTINE W REFLEX MICROSCOPIC - Abnormal; Notable for the following components:   APPearance HAZY (*)    Leukocytes,Ua TRACE (*)    All other components within  normal limits  CBC  LIPASE, BLOOD  POC OCCULT BLOOD, ED    EKG None  Radiology CT ABDOMEN PELVIS W CONTRAST Result Date: 10/25/2023 CLINICAL DATA:  Left lower quadrant abdominal pain, rectal bleeding and history of prior diverticulitis. EXAM: CT ABDOMEN AND PELVIS WITH CONTRAST TECHNIQUE: Multidetector CT imaging of the abdomen and pelvis was performed using the standard protocol following bolus administration of intravenous contrast. RADIATION DOSE REDUCTION: This exam was performed according to the departmental dose-optimization program which  includes automated exposure control, adjustment of the mA and/or kV according to patient size and/or use of iterative reconstruction technique. CONTRAST:  OMNIPAQUE IOHEXOL 300 MG/ML  SOLN COMPARISON:  CTA of the abdomen and pelvis on 09/25/2023 FINDINGS: Lower chest: Stable mosaic ground-glass opacity in the lung parenchyma at both lung bases, left greater than right. This is favored to represent some type of chronic lung disease. No pleural fluid. Stable small hiatal hernia. Hepatobiliary: Stable hepatic steatosis. Normal appearance of the gallbladder. No biliary ductal dilatation. Pancreas: Unremarkable. No pancreatic ductal dilatation or surrounding inflammatory changes. Spleen: Normal in size without focal abnormality. Adrenals/Urinary Tract: Adrenal glands are unremarkable. Kidneys are normal, without renal calculi, focal lesion, or hydronephrosis. Bladder is unremarkable. Stomach/Bowel: Previously noted mild segmental colitis of the descending colon appears improved with no significant residual acute inflammation remaining. Stable diverticulosis of the sigmoid colon without findings of acute diverticulitis. No bowel obstruction, ileus or free intraperitoneal air. Normal appendix. Vascular/Lymphatic: Stable mild atherosclerosis of the abdominal aorta without aneurysm. No lymphadenopathy identified. Reproductive: Uterus and bilateral adnexa are unremarkable. Other: No abdominal wall hernia or abnormality. No focal abscess or ascites. Musculoskeletal: No acute or significant osseous findings. IMPRESSION: 1. Previously noted mild segmental colitis of the descending colon appears improved with no significant residual acute inflammation remaining. 2. Stable diverticulosis of the sigmoid colon without findings of acute diverticulitis. 3. Stable hepatic steatosis. 4. Stable small hiatal hernia. 5. Stable mosaic ground-glass opacity in the lung parenchyma at both lung bases, left greater than right. This is  favored to represent some type of chronic lung disease. 6. Stable mild atherosclerosis of the abdominal aorta without aneurysm. Aortic Atherosclerosis (ICD10-I70.0). Electronically Signed   By: Irish Lack M.D.   On: 10/25/2023 17:02    Procedures Procedures    Medications Ordered in ED Medications  iohexol (OMNIPAQUE) 300 MG/ML solution 100 mL (100 mLs Intravenous Contrast Given 10/25/23 1625)  acetaminophen (TYLENOL) tablet 1,000 mg (1,000 mg Oral Given 10/25/23 2027)    ED Course/ Medical Decision Making/ A&P                          Medical Decision Making Amount and/or Complexity of Data Reviewed Labs:  Decision-making details documented in ED Course. Radiology:  Decision-making details documented in ED Course.  Risk OTC drugs.    This patient presents to the ED for concern of abd pain and LGIB, this involves an extensive number of treatment options, and is a complaint that carries with it a high risk of complications and morbidity.  I considered the following differential and admission for this acute, potentially life threatening condition.   MDM:    For DDX for abdominal pain includes but is not limited to:  Abdominal exam without peritoneal signs. No evidence of acute abdomen at this time. Highest degree of concern for diverticulitis as she had similar sxs recently d/t the same. Low suspicion for acute hepatobiliary disease (including acute cholecystitis or cholangitis),  acute pancreatitis (neg lipase), acute appendicitis, vascular catastrophe. Based on clinical presentation, hematochezia reported, most concerned about LGIB.  No external hemorrhoids or anal fissure noted on exam.    CT abdomen pelvis is reassuring against emergent pathology.  It does note diverticulosis which could be the source of her bleeding.  CT scan does not identify any ischemic colitis and in fact the previously noted colitis on a prior scan is actually resolved now.  Rectal exam also demonstrates  no gross blood and fecal occult is negative today. Patient states she did have a colonoscopy about a year ago but I affirmed that she will need to follow-up with GI as she was told on her recent discharge from the hospital.  Clinical Course as of 10/25/23 2054  Thu Oct 25, 2023  1937 CT ABDOMEN PELVIS W CONTRAST 1. Previously noted mild segmental colitis of the descending colon appears improved with no significant residual acute inflammation remaining. 2. Stable diverticulosis of the sigmoid colon without findings of acute diverticulitis. 3. Stable hepatic steatosis. 4. Stable small hiatal hernia. 5. Stable mosaic ground-glass opacity in the lung parenchyma at both lung bases, left greater than right. This is favored to represent some type of chronic lung disease. 6. Stable mild atherosclerosis of the abdominal aorta without aneurysm.   [HN]  1937 CBC wnl [HN]  1937 Hemoglobin: 14.9 Stable, no ABLA [HN]  1937 Comprehensive metabolic panel(!) Mildly elevated AST/ALT similar to prior [HN]  1937 Lipase: 36 neg [HN]  1937 Urinalysis, Routine w reflex microscopic -Urine, Clean Catch(!) Mild leukocytes with no bacteria. She reports she hasn't taken any antibiotics at home. No urinary sxs, no flank pain. Unlikely UTI causing her abdominal discomfort. [HN]  2053 Fecal Occult Blood, POC: NEGATIVE [HN]  2053 Patient with negative fecal occult, stable hemoglobin, reassuring workup.  She does have mild transaminitis which is similar to prior.  Advise follow-up with GI as originally planned after admission.  Patient is advised to take Tylenol for pain at home and stay well-hydrated.  Instructed to call GI tomorrow morning make an appointment within the next 1 to 2 weeks.  Given discharge instructions and return precautions, all questions answered to patient satisfaction. [HN]    Clinical Course User Index [HN] Loetta Rough, MD    Labs: I Ordered, and personally interpreted labs.  The  pertinent results include: Those listed above  Imaging Studies ordered: CT abdomen pelvis ordered from triage I independently visualized and interpreted imaging. I agree with the radiologist interpretation  Additional history obtained from chart review.    Cardiac Monitoring: The patient was maintained on a cardiac monitor.  I personally viewed and interpreted the cardiac monitored which showed an underlying rhythm of: Normal sinus rhythm  Reevaluation: After the interventions noted above, I reevaluated the patient and found that they have :improved  Social Determinants of Health:  lives independently  Disposition: DC  Co morbidities that complicate the patient evaluation  Past Medical History:  Diagnosis Date   Anxiety    in the past   Anxiety and depression    Arthritis    Chronic bilateral low back pain 07/10/2017   Depression    in the past.  Depression and anxiety starting in childhood. No counseling until 2015    PTSD (post-traumatic stress disorder) 07/10/2017   PTSD (post-traumatic stress disorder)    Syncope      Medicines Meds ordered this encounter  Medications   iohexol (OMNIPAQUE) 300 MG/ML solution 100 mL   acetaminophen (  TYLENOL) tablet 1,000 mg    I have reviewed the patients home medicines and have made adjustments as needed  Problem List / ED Course: Problem List Items Addressed This Visit   None Visit Diagnoses       Lower abdominal pain    -  Primary     Hematochezia                       This note was created using dictation software, which may contain spelling or grammatical errors.    Loetta Rough, MD 10/28/23 [id number]

## 2023-12-24 ENCOUNTER — Ambulatory Visit: Payer: Self-pay | Admitting: Internal Medicine

## 2023-12-24 VITALS — BP 134/68 | HR 70 | Resp 16 | Ht 68.75 in | Wt 203.0 lb

## 2023-12-24 DIAGNOSIS — K5730 Diverticulosis of large intestine without perforation or abscess without bleeding: Secondary | ICD-10-CM

## 2023-12-24 DIAGNOSIS — F32A Depression, unspecified: Secondary | ICD-10-CM

## 2023-12-24 DIAGNOSIS — R748 Abnormal levels of other serum enzymes: Secondary | ICD-10-CM

## 2023-12-24 DIAGNOSIS — R739 Hyperglycemia, unspecified: Secondary | ICD-10-CM

## 2023-12-24 DIAGNOSIS — Z716 Tobacco abuse counseling: Secondary | ICD-10-CM

## 2023-12-24 DIAGNOSIS — K760 Fatty (change of) liver, not elsewhere classified: Secondary | ICD-10-CM

## 2023-12-24 DIAGNOSIS — G8929 Other chronic pain: Secondary | ICD-10-CM

## 2023-12-24 DIAGNOSIS — J449 Chronic obstructive pulmonary disease, unspecified: Secondary | ICD-10-CM | POA: Insufficient documentation

## 2023-12-24 DIAGNOSIS — K5733 Diverticulitis of large intestine without perforation or abscess with bleeding: Secondary | ICD-10-CM

## 2023-12-24 DIAGNOSIS — F17200 Nicotine dependence, unspecified, uncomplicated: Secondary | ICD-10-CM

## 2023-12-24 DIAGNOSIS — M542 Cervicalgia: Secondary | ICD-10-CM

## 2023-12-24 DIAGNOSIS — B182 Chronic viral hepatitis C: Secondary | ICD-10-CM

## 2023-12-24 DIAGNOSIS — D126 Benign neoplasm of colon, unspecified: Secondary | ICD-10-CM

## 2023-12-24 DIAGNOSIS — F419 Anxiety disorder, unspecified: Secondary | ICD-10-CM

## 2023-12-24 MED ORDER — TRELEGY ELLIPTA 100-62.5-25 MCG/ACT IN AEPB: INHALATION_SPRAY | RESPIRATORY_TRACT | 11 refills | Status: DC

## 2023-12-24 NOTE — Progress Notes (Unsigned)
 Subjective:    Patient ID: Crystal Baker, female   DOB: 1966/03/24, 59 y.o.   MRN: [id number]   HPI  Here to re establish   Noted diffuse lower abdominal pain and blood in stool for which diverticulitis diagnosed in November 2024 and was hospitalized at [facility].  Was told she had a fatty liver, which was confirmed on CT scan of abdomen with elevated transaminases.CT also showed descending diverticulosis and diverticulitis.  Treated for 2 days with Unasyn IV and then switched to oral Augmentin for 7 more days.  Diffuse abdominal pain resolved with treatment.   She suffered painless blood day after Christmas and returned to ED, though CT of abdomen was negative for diverticulitis.   She had BRBPR without stool and on tissue with this episode with mild abdominal pain associated.  She has noted small caliber and ribbon like stools for years prior.   No gross blood nor hemoccult + in ED.  Was sent home.    2.  Fatty Liver:  No history of elevated cholesterol.  She does drink alcohol [place]aws, 2-3 after work.  Will drink 6 [place]aws on her day off once weekly. She was treated successfully for chronic Hepatitis C in [place] over 20 years ago.  She is not aware of whether she was vaccinated for Hep A or B with this diagnosis.  3.  Depression and Anxiety:  Goes to [employer].  No counseling.  Costing more to have med visit, though meds inexpensive through [place].  4.  COPD:  Has been to pulmonology and told beginning of COPD and noted on CT of abdomen.  Started age 54 and currently 1/2 ppd now.  Previously 1 ppd.  5.  Chronic neck pain:  was told her fusions were falling apart.  Not clear whether a plan was made as ran out of Medicaid when came into inheritance.    No outpatient medications have been marked as taking for the 12/24/23 encounter (Office Visit) with Julieanne Manson, MD.   Allergies  Allergen Reactions   Naproxen Itching and Rash    Palpitations in  high doses   Prednisone Palpitations   Tramadol Itching and Rash    Palpitations    Past Medical History:  Diagnosis Date   Anxiety    in the past   Anxiety and depression    Arthritis    Chronic bilateral low back pain 07/10/2017   Depression    in the past.  Depression and anxiety starting in childhood. No counseling until 2015    PTSD (post-traumatic stress disorder) 07/10/2017   PTSD (post-traumatic stress disorder)    Syncope    Past Surgical History:  Procedure Laterality Date   BACK SURGERY     NECK SURGERY       Review of Systems    Objective:   BP 134/68 (BP Location: Right Arm, Patient Position: Sitting, Cuff Size: Normal)   Pulse 70   Resp 16   Ht 5' 8.75" (1.746 m)   Wt 203 lb (92.1 kg)   BMI 30.20 kg/m   Physical Exam NAD Skin diffusely mottled with scattered pigmentation loss HEENT:  PERRL, EOMI, TMs pearly gray, throat without injection Neck:  Supple, No adenopathy, no thyromegaly Chest:  CTA CV:  RRR with normal S1 and S2, No S3, S4 or murmur.  No carotid bruits.  Carotid, radial and DP pulses normal and equal Abd:  S, NT, No HSM or mass +BS LE:  no  edema.   Assessment & Plan   Diverticulosis with resolved diverticulitis:  Discussed diet and fiber to prevent progression of diverticulosis.  Not clear where had colonoscopy done--perhaps [facility].  She will look at her records at home and get back.  Release of info needed.    2.  Elevated transaminases:  AST mildly> ALT.  Does have hepatic steatosis.  Check cholesterol panel.  Encouraged her to stop drinking alcoholic beverages.  History of Hepatitis C treated many years ago and concerned may have "reactivated".  Check titre for Hep C.  Check A and B serology for immunity and vaccination purposes.    3.  Depression and Anxiety:  she does not feel she needs counseling.  Meds from [facility] added after visit with call to [facility] pharmacy.  Concerned she is using alcohol, however to self treat.  4.   COPD/Tobacco abuse:  She does not feel symptomatic and so has not utilized inhaler previously.  She has become more sedentary, however.  Will try Trelegy and see if able to be more physically active.   Discussed at length using nicotine gum or lozenge to quit.   Ask her boyfriend to smoke outside.   Not clear she is motivated.  5. Chronic neck pain:  will need to review her chart over past number of years to see what plan was.  6.  Hyperglycemia:  from hospital when ill.  Check Q6V to be certain no concern

## 2023-12-24 NOTE — Patient Instructions (Signed)
 Tobacco Cessation:   1800QUITNOW or [phone], the former for support and possibly free nicotine patches/gum and support; the latter for [facility] Smoking cessation class. Get rid of all smoking supplies:  Cigarettes, lighters, ashtrays--no stashes just in case at home if you are serious.  Decrease or stop all alcohol

## 2023-12-25 LAB — LIPID PANEL W/O CHOL/HDL RATIO
Cholesterol, Total: 206 mg/dL — ABNORMAL HIGH (ref 100–199)
HDL: 76 mg/dL (ref >39)
LDL Chol Calc (NIH): 116 mg/dL — ABNORMAL HIGH (ref 0–99)
Triglycerides: 80 mg/dL (ref 0–149)
VLDL Cholesterol Cal: 14 mg/dL (ref 5–40)

## 2023-12-25 LAB — HEPATITIS B SURFACE ANTIBODY,QUALITATIVE: Hep B Surface Ab, Qual: REACTIVE

## 2023-12-25 LAB — HEPATITIS B CORE ANTIBODY, TOTAL: Hep B Core Total Ab: NEGATIVE

## 2023-12-25 LAB — HGB A1C W/O EAG: Hgb A1c MFr Bld: 5.3 % (ref 4.8–5.6)

## 2023-12-25 LAB — HEPATITIS B SURFACE ANTIGEN: Hepatitis B Surface Ag: NEGATIVE

## 2023-12-25 LAB — HEPATITIS A ANTIBODY, TOTAL: hep A Total Ab: NEGATIVE

## 2023-12-26 ENCOUNTER — Encounter: Payer: Self-pay | Admitting: Internal Medicine

## 2023-12-26 DIAGNOSIS — R739 Hyperglycemia, unspecified: Secondary | ICD-10-CM | POA: Insufficient documentation

## 2023-12-26 DIAGNOSIS — R748 Abnormal levels of other serum enzymes: Secondary | ICD-10-CM | POA: Insufficient documentation

## 2023-12-26 DIAGNOSIS — G8929 Other chronic pain: Secondary | ICD-10-CM | POA: Insufficient documentation

## 2023-12-26 DIAGNOSIS — K760 Fatty (change of) liver, not elsewhere classified: Secondary | ICD-10-CM | POA: Insufficient documentation

## 2023-12-26 LAB — HCV RNA QUANT
HCV log10: 6.32 {Log_IU}/mL
Hepatitis C Quantitation: 2090000 [IU]/mL

## 2023-12-27 ENCOUNTER — Ambulatory Visit: Payer: Self-pay

## 2023-12-27 DIAGNOSIS — Z23 Encounter for immunization: Secondary | ICD-10-CM

## 2024-01-09 ENCOUNTER — Other Ambulatory Visit (HOSPITAL_COMMUNITY): Payer: Self-pay

## 2024-01-09 ENCOUNTER — Telehealth: Payer: Self-pay

## 2024-01-09 NOTE — Telephone Encounter (Signed)
 [facility] Patient Advocate Encounter ? ?Insurance verification completed.   ? ?The patient is uninsured and will need patient assistance for medication. ? ?We can complete the application and will need to meet with the patient for signatures and income documentation. ? ?Clearance Coots, CPhT ?Specialty Pharmacy Patient Advocate ?[facility] ?Phone: [phone] ?Fax:  [phone]  ?

## 2024-01-15 ENCOUNTER — Encounter: Payer: Self-pay | Admitting: Infectious Disease

## 2024-01-15 ENCOUNTER — Other Ambulatory Visit (HOSPITAL_COMMUNITY): Payer: Self-pay

## 2024-01-15 DIAGNOSIS — B182 Chronic viral hepatitis C: Secondary | ICD-10-CM

## 2024-01-15 HISTORY — DX: Chronic viral hepatitis C: B18.2

## 2024-01-16 ENCOUNTER — Other Ambulatory Visit: Payer: Self-pay

## 2024-01-16 ENCOUNTER — Encounter: Payer: Self-pay | Admitting: Infectious Disease

## 2024-01-16 ENCOUNTER — Ambulatory Visit (INDEPENDENT_AMBULATORY_CARE_PROVIDER_SITE_OTHER): Payer: Self-pay | Admitting: Infectious Disease

## 2024-01-16 ENCOUNTER — Other Ambulatory Visit (HOSPITAL_COMMUNITY): Payer: Self-pay

## 2024-01-16 VITALS — BP 126/78 | HR 70 | Temp 97.9°F | Wt 189.0 lb

## 2024-01-16 DIAGNOSIS — K5792 Diverticulitis of intestine, part unspecified, without perforation or abscess without bleeding: Secondary | ICD-10-CM

## 2024-01-16 DIAGNOSIS — K7581 Nonalcoholic steatohepatitis (NASH): Secondary | ICD-10-CM

## 2024-01-16 DIAGNOSIS — Z7185 Encounter for immunization safety counseling: Secondary | ICD-10-CM

## 2024-01-16 DIAGNOSIS — Z87898 Personal history of other specified conditions: Secondary | ICD-10-CM

## 2024-01-16 DIAGNOSIS — B182 Chronic viral hepatitis C: Secondary | ICD-10-CM

## 2024-01-16 NOTE — Patient Instructions (Signed)
 [facility] Billing/ Financail assistance [phone]
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
## 2024-01-20 LAB — HEPATITIS C GENOTYPE

## 2024-02-25 ENCOUNTER — Telehealth: Payer: Self-pay | Admitting: Infectious Disease

## 2024-02-25 ENCOUNTER — Other Ambulatory Visit (HOSPITAL_COMMUNITY): Payer: Self-pay

## 2024-02-25 NOTE — Telephone Encounter (Signed)
 Crystal Baker called to cancel her upcoming appointment due to insurance but will reschedule at a later date. She would like a call to discuss her most recent labs. She can be reached at [phone].

## 2024-02-26 ENCOUNTER — Ambulatory Visit: Payer: Self-pay | Admitting: Infectious Disease

## 2024-03-10 ENCOUNTER — Telehealth: Payer: Self-pay

## 2024-03-10 NOTE — Telephone Encounter (Signed)
[facility] Patient Advocate Encounter ° °Completed and sent [employer] application for Mavyret for this patient who is uninsured.   ° °Patient assistance phone number for follow up is [phone].  ° °This encounter will be updated until final determination.  ° °Ayda Tancredi, CPhT °Specialty Pharmacy Patient Advocate °[facility] °Phone: [phone] °Fax:  [phone] ° °

## 2024-03-10 NOTE — Telephone Encounter (Signed)
 Patient aware labs tell us  that she still has hep C. Further testing will need to be done. Abe Abed spoke to patient regarding insurance/medicaid.

## 2024-03-10 NOTE — Telephone Encounter (Signed)
 She can do Careers adviser - either is fine

## 2024-03-10 NOTE — Telephone Encounter (Signed)
 Abe Abed once you have her pay stubs, can you send to Deanna so she can review if patient qualifies for Medicaid?

## 2024-03-10 NOTE — Telephone Encounter (Signed)
 Sounds good thanks Abe Abed - I'll review her chart in a few and let you know which option is best.

## 2024-03-10 NOTE — Telephone Encounter (Signed)
 Based on that message, I believe her goal was to enroll in [place]? Has she tried? Hopefully she is a candidate. We can certainly seek manufacturer coverage for HCV medications. I know we treat HCV patients who are uninsured, and I'm not sure if they apply for [facility] assistance for labs and office copays. It would be nice to have updated CMP and CBC for more accurate fib4 score calculation.

## 2024-03-10 NOTE — Telephone Encounter (Signed)
 Patient called to follow up regarding this. Patient doesn't have insurance so she isn't scheduled for an appointment.

## 2024-03-17 ENCOUNTER — Telehealth: Payer: Self-pay

## 2024-03-17 NOTE — Telephone Encounter (Signed)
 [facility] Patient Advocate Encounter  Completed and sent MY[employer]IE application for Mavyret for this patient who is uninsured.    Patient is approved 03/14/24 through 09/13/24.  Medication will be shipped to the patient home.   Roylene Corn, CPhT Specialty Pharmacy Patient [facility] Phone: [phone] Fax:  [phone]

## 2024-03-19 ENCOUNTER — Telehealth: Payer: Self-pay | Admitting: Pharmacist

## 2024-03-19 NOTE — Telephone Encounter (Signed)
 Called patient for Mavyret medication counseling. Counseled to take 3 tablets once daily at the same time with food for proper absorption. Reviewed common side effects and counseled on importance of adherence to achieve SVR. She is approved to receive the medication through manufacturer assistance and will start taking it when she receives it in the mail. Follow up pharmacy visit scheduled for 04/23/24. Encouraged her to reach out with any additional questions.  Georga Killings, PharmD PGY-1 Pharmacy Resident

## 2024-03-25 ENCOUNTER — Telehealth: Payer: Self-pay

## 2024-03-25 NOTE — Telephone Encounter (Signed)
 Excellent - thank you! She can take all of these without any issues.

## 2024-03-25 NOTE — Telephone Encounter (Signed)
 Patient called, states she's starting HCV treatment soon and wanted to make sure the office had an updated list of her current medications to assess for potential DDIs.   Confirmed that she is taking bupropion, fluoxetine , and Trelegy Ellipta .   Beryl Balz, BSN, RN

## 2024-04-22 NOTE — Progress Notes (Unsigned)
 HPI: Crystal Baker is a 58 y.o. female who presents to the [facility] pharmacy clinic for Hepatitis C follow-up.  Medication: Mavyret x 8 weeks  Start Date: 03/25/24  Hepatitis C Genotype: 1b  Fibrosis Score: F3/4, Fib-4 2.92 (Age 2, AST 66, ALT 55, PLT 174). (Cutoff of >3.25 cirrhosis per AASLD). This indicates unlikely cirrhosis. PLT count also preserved (174).  Hepatitis C RNA: 2,090,000   Patient Active Problem List   Diagnosis Date Noted   Chronic hepatitis C without hepatic coma (HCC) 01/15/2024   Hepatic steatosis 12/26/2023   Chronic neck pain 12/26/2023   Elevated liver enzymes 12/26/2023   Hyperglycemia 12/26/2023   Chronic obstructive pulmonary disease (HCC) 12/24/2023   Diverticulosis of colon 12/24/2023   Adenomatous polyp of colon 12/24/2023   Diverticulitis 09/26/2023   Diverticulitis large intestine 09/25/2023   Chronic bilateral low back pain 07/10/2017   PTSD (post-traumatic stress disorder) 07/10/2017   Depression    Chest pain 03/29/2016   Syncope 03/29/2016   Tobacco use disorder 03/29/2016   Anxiety and depression 03/29/2016   Interscapular pain 03/29/2016    Patient's Medications  New Prescriptions   No medications on file  Previous Medications   BUPROPION (WELLBUTRIN XL) 150 MG 24 HR TABLET    Take 150 mg by mouth in the morning and at bedtime.   FLUOXETINE  (PROZAC ) 20 MG TABLET    Take 20 mg by mouth in the morning, at noon, and at bedtime.   FLUTICASONE-UMECLIDIN-VILANT (TRELEGY ELLIPTA ) 100-62.5-25 MCG/ACT AEPB    1 inhalation once daily  Modified Medications   No medications on file  Discontinued Medications   No medications on file    Labs: Hepatitis C Lab Results  Component Value Date   HCVGENOTYPE 1b 01/16/2024   Hepatitis B Lab Results  Component Value Date   HEPBSAG Negative 12/24/2023   HEPBCAB Negative 12/24/2023   Hepatitis A Lab Results  Component Value Date   HAV Negative 12/24/2023   HIV Lab Results   Component Value Date   HIV Non Reactive 09/25/2023   Lab Results  Component Value Date   CREATININE 0.82 10/25/2023   CREATININE 0.94 09/26/2023   CREATININE 0.81 09/25/2023   CREATININE 0.79 12/02/2021   CREATININE 0.87 02/10/2018   Lab Results  Component Value Date   AST 66 (H) 10/25/2023   AST 42 (H) 09/25/2023   AST 33 02/10/2018   ALT 55 (H) 10/25/2023   ALT 51 (H) 09/25/2023   ALT 36 02/10/2018    Assessment: Crystal Baker presents today for 1 month follow up on Mavyret for Hepatitis C. Her start date was 03/25/24. She reports 0 missed doses. Does report taking 3 tablets on an empty stomach. Counseled to take 3 tablets once daily at the same time with food for proper absorption. Reviewed common side effects and counseled on importance of adherence to achieve SVR. Notable that her Fib4 score was 2.92 (Age 63, AST 66, ALT 55, PLT 174) with laboratory values from 09/2023. This indicates fibrosis, but is below threshold to be considered cirrhosis per AASLD guidelines (FIB-4 score >3.25). Out of caution, will obtain CMP and CBC today to reassess Fib4, as well as to monitor on treatment due to risk of decompensation on protease inhibitors for patients with cirrhosis. She reports no signs of hepatic decompensation.  Immunizations: Eligible for PCV20 (Age >50), Shingrix. She is due for second Hep A vaccine on 06/25/24. She declines these today.   Plan: - HCV RNA, CMP, CBC - Follow  up at [facility] on 7/28 with [name], plan for SVR with Dr. Fleeta Rothman - Call with any questions or concerns  Con Laughter, PharmD PGY-2 Infectious Diseases Pharmacy Resident [facility] 04/22/2024 8:56 PM

## 2024-04-23 ENCOUNTER — Other Ambulatory Visit: Payer: Self-pay

## 2024-04-23 ENCOUNTER — Ambulatory Visit: Payer: Self-pay | Admitting: Pharmacist

## 2024-04-23 DIAGNOSIS — B1920 Unspecified viral hepatitis C without hepatic coma: Secondary | ICD-10-CM

## 2024-04-23 DIAGNOSIS — B182 Chronic viral hepatitis C: Secondary | ICD-10-CM

## 2024-04-23 MED ORDER — MAVYRET 100-40 MG PO TABS: 3.0000 | ORAL_TABLET | Freq: Every day | ORAL | Status: DC

## 2024-04-25 LAB — CBC
HCT: 43.6 % (ref 35.0–45.0)
Hemoglobin: 14.3 g/dL (ref 11.7–15.5)
MCH: 32.4 pg (ref 27.0–33.0)
MCHC: 32.8 g/dL (ref 32.0–36.0)
MCV: 98.9 fL (ref 80.0–100.0)
MPV: 11.2 fL (ref 7.5–12.5)
Platelets: 194 10*3/uL (ref 140–400)
RBC: 4.41 10*6/uL (ref 3.80–5.10)
RDW: 12 % (ref 11.0–15.0)
WBC: 6 10*3/uL (ref 3.8–10.8)

## 2024-04-25 LAB — COMPREHENSIVE METABOLIC PANEL WITH GFR
AG Ratio: 1.4 (calc) (ref 1.0–2.5)
ALT: 17 U/L (ref 6–29)
AST: 22 U/L (ref 10–35)
Albumin: 4.2 g/dL (ref 3.6–5.1)
Alkaline phosphatase (APISO): 100 U/L (ref 37–153)
BUN: 13 mg/dL (ref 7–25)
CO2: 24 mmol/L (ref 20–32)
Calcium: 9.4 mg/dL (ref 8.6–10.4)
Chloride: 103 mmol/L (ref 98–110)
Creat: 0.94 mg/dL (ref 0.50–1.03)
Globulin: 3 g/dL (ref 1.9–3.7)
Glucose, Bld: 100 mg/dL — ABNORMAL HIGH (ref 65–99)
Potassium: 4 mmol/L (ref 3.5–5.3)
Sodium: 137 mmol/L (ref 135–146)
Total Bilirubin: 0.4 mg/dL (ref 0.2–1.2)
Total Protein: 7.2 g/dL (ref 6.1–8.1)
eGFR: 71 mL/min/{1.73_m2} (ref >=60)

## 2024-04-25 LAB — HEPATITIS C RNA QUANTITATIVE
HCV Quantitative Log: <1.18 {Log_IU}/mL
HCV RNA, PCR, QN: <15 [IU]/mL

## 2024-05-09 ENCOUNTER — Emergency Department (HOSPITAL_COMMUNITY)
Admission: EM | Admit: 2024-05-09 | Discharge: 2024-05-09 | Disposition: A | Discharge status: Home / Self Care | Payer: Self-pay | Attending: Emergency Medicine | Admitting: Emergency Medicine

## 2024-05-09 ENCOUNTER — Encounter (HOSPITAL_COMMUNITY): Payer: Self-pay

## 2024-05-09 ENCOUNTER — Other Ambulatory Visit: Payer: Self-pay

## 2024-05-09 DIAGNOSIS — W01198A Fall on same level from slipping, tripping and stumbling with subsequent striking against other object, initial encounter: Secondary | ICD-10-CM | POA: Insufficient documentation

## 2024-05-09 DIAGNOSIS — W19XXXA Unspecified fall, initial encounter: Secondary | ICD-10-CM

## 2024-05-09 DIAGNOSIS — S161XXA Strain of muscle, fascia and tendon at neck level, initial encounter: Secondary | ICD-10-CM | POA: Insufficient documentation

## 2024-05-09 DIAGNOSIS — M546 Pain in thoracic spine: Secondary | ICD-10-CM | POA: Insufficient documentation

## 2024-05-09 MED ORDER — METHOCARBAMOL 500 MG PO TABS: 500.0000 mg | ORAL_TABLET | Freq: Four times a day (QID) | ORAL | 0 refills | Status: DC | PRN
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
## 2024-05-09 NOTE — Discharge Instructions (Addendum)
 1.  At this time you appear to have gotten a strain in the muscles of your neck and shoulder and upper back from a fall.  Currently you do not appear to have any broken bones. 2.  This may become more sore and stiff within the next 2 to 5 days.  Take ibuprofen 600 mg every 8 hours as needed for pain.  You may also take Robaxin  as prescribed for muscle relaxer.  Apply cool ice packs to areas of stiffness and swelling.  After several days you may start using warm moist heat.  You may also apply over-the-counter Lidoderm  patches to your more sore areas. 3.  Schedule follow-up appointment with your doctor in 3 to 5 days to determine if you need any additional treatment or evaluation such as physical therapy or ongoing muscle relaxers. 4.  Return to Emergency Department immediately if you have numbness weakness or tingling to your arms or legs, shortness of breath, severely increasing pain or other concerning changes.

## 2024-05-09 NOTE — ED Notes (Signed)
 Pt has hx c-spine fusions, and is having trouble turning her head R (worse than bsl) s/p fall

## 2024-05-09 NOTE — ED Provider Notes (Signed)
 [place] EMERGENCY DEPARTMENT AT [facility] Provider Note   CSN: [id number] Arrival date & time: 05/09/24  1530     Patient presents with: Fall and Shoulder Pain   Crystal Baker is a 58 y.o. female.   HPI Patient reports she got a grocery store after work to pick up some things.  When she came out she slipped and fell backwards landing on her back and hitting her head a little bit.  She denies loss of consciousness.  She reports she temporarily felt a little dazed.  Patient denies being any blood thinners.  She reports she does have stiffness in her neck especially on the right side.  She is also got some pain and stiffness in her upper back.  However, she reports she does not feel like there is anything broken.  She can rotate her shoulder and ambulate without difficulty.  Patient denies any pain or difficulty taking a deep breath.  Patient reports history of prior cervical spine fixation surgery that had come loose.  She reports she has had chronic problems of some neck pain and stiffness but after this fall does not feel like anything is broken.    Prior to Admission medications   Medication Sig Start Date End Date Taking? Authorizing Provider  methocarbamol  (ROBAXIN ) 500 MG tablet Take 1 tablet (500 mg total) by mouth every 6 (six) hours as needed for muscle spasms. 05/09/24  Yes Armenta Canning, MD  buPROPion (WELLBUTRIN XL) 150 MG 24 hr tablet Take 150 mg by mouth in the morning and at bedtime.    [provider]  FLUoxetine  (PROZAC ) 20 MG tablet Take 20 mg by mouth in the morning, at noon, and at bedtime.    [provider]  Fluticasone-Umeclidin-Vilant (TRELEGY ELLIPTA ) 100-62.5-25 MCG/ACT AEPB 1 inhalation once daily Patient not taking: Reported on 01/16/2024 12/24/23   Adella Norris, MD  Glecaprevir-Pibrentasvir (MAVYRET ) 100-40 MG TABS Take 3 tablets by mouth daily with breakfast. 04/23/24   Waddell Alan PARAS, RPH-CPP    Allergies:  Naproxen , Prednisone , and Tramadol     Review of Systems  Updated Vital Signs BP 116/76   Pulse 77   Temp 98.5 F (36.9 C) (Oral)   Resp 18   Ht 5' 8 (1.727 m)   Wt 85.7 kg   SpO2 96%   BMI 28.73 kg/m   Physical Exam Constitutional:      Comments: The patient is alert nontoxic.  Mental status clear.  No acute distress.  No respiratory distress.  Well-nourished well-developed.  HENT:     Head: Normocephalic and atraumatic.     Mouth/Throat:     Pharynx: Oropharynx is clear.  Eyes:     Extraocular Movements: Extraocular movements intact.  Neck:     Comments: No midline C-spine tenderness.  Patient has decreased range of motion with turning her head to the right.  She reports she has some decreased range of motion at baseline but this little bit more than usual for her. Cardiovascular:     Rate and Rhythm: Normal rate and regular rhythm.  Pulmonary:     Effort: Pulmonary effort is normal.     Breath sounds: Normal breath sounds.     Comments: Chest wall nontender to compression.  No crepitus. Abdominal:     General: There is no distension.     Palpations: Abdomen is soft.     Tenderness: There is no abdominal tenderness. There is no guarding.  Musculoskeletal:     Comments: Reproducible  discomfort at the upper thoracic back and along the top of the scapula toward the shoulder.  Patient does have intact range of motion at the shoulder.  She can elevate and rotate spontaneously.  No deformity no bruising.  Skin:    General: Skin is warm and dry.  Neurological:     General: No focal deficit present.     Mental Status: She is oriented to person, place, and time.     Motor: No weakness.     Coordination: Coordination normal.  Psychiatric:        Mood and Affect: Mood normal.     (all labs ordered are listed, but only abnormal results are displayed) Labs Reviewed - No data to display  EKG: None  Radiology: No results found.   Procedures   Medications Ordered in  the ED - No data to display                                  Medical Decision Making  Patient had a fall as outlined.  She does not take any anticoagulants.  Patient did not have any loss of consciousness.  She does not have any mental status change or visual disturbance.  At this time I do not feel that the patient needs CT scanning of the head.  Patient reports prior cervical spine fixation which subsequently had some failure of the procedure.  Patient does have decreased range of motion with lateral turning of the head to the right.  She does not have midline tenderness to palpation or any midline pain.  I discussed CT scanning of the cervical spine with the patient.  She reports at this time she truly does not believe anything is broken.  She reports she has had broken bones before and does not feel the need to proceed with CT scanning.  We also discussed possible chest x-ray for broken rib but again she reports she has had a broken rib in the past and does not feel that she is likely to have one.  She does not have any shortness of breath or splinting.  At this time we will opt to treat conservatively with anti-inflammatories and muscle relaxers.  I did reports the patient that muscle strain is most painful and 2 to 5 days.  We discussed management at home.  Return precautions are included in the discharge instructions with recommendations for follow-up.     Final diagnoses:  Fall, initial encounter  Acute right-sided thoracic back pain  Acute strain of neck muscle, initial encounter    ED Discharge Orders          Ordered    methocarbamol  (ROBAXIN ) 500 MG tablet  Every 6 hours PRN        05/09/24 1640               Armenta Canning, MD 05/09/24 1649

## 2024-05-09 NOTE — ED Notes (Signed)
 Pt declined last set vs

## 2024-05-09 NOTE — ED Notes (Signed)
 Patient is resting comfortably.

## 2024-05-15 ENCOUNTER — Telehealth: Payer: Self-pay

## 2024-05-15 NOTE — Telephone Encounter (Signed)
 Patient called, she normally takes her Mavyret  around 8:30 every morning. She forgot to take it today and was wondering what to do.   Confirmed with Alan Geralds, [facility] that she can go ahead and take her dose today and resume her usual dosing schedule of 8:30 AM tomorrow. Patient verbalized understanding and has no further questions.   Rishaan Gunner, BSN, RN

## 2024-05-21 NOTE — Progress Notes (Deleted)
   HPI: Aviannah Castoro is a 58 y.o. female who presents to the [facility] pharmacy clinic for Hepatitis C follow-up.  Medication: Mavyret  x 8 weeks   Start Date: 03/25/24   Hepatitis C Genotype: 1b   Fibrosis Score: F3/4, Fib-4 2.92 (Age 72, AST 66, ALT 55, PLT 174). (Cutoff of >3.25 cirrhosis per AASLD). This indicates unlikely cirrhosis. PLT count also preserved (174).   Hepatitis C RNA: 2,090,000 on 12/24/23; <15 on 04/23/24  Patient Active Problem List   Diagnosis Date Noted   Chronic hepatitis C without hepatic coma (HCC) 01/15/2024   Hepatic steatosis 12/26/2023   Chronic neck pain 12/26/2023   Elevated liver enzymes 12/26/2023   Hyperglycemia 12/26/2023   Chronic obstructive pulmonary disease (HCC) 12/24/2023   Diverticulosis of colon 12/24/2023   Adenomatous polyp of colon 12/24/2023   Diverticulitis 09/26/2023   Diverticulitis large intestine 09/25/2023   Chronic bilateral low back pain 07/10/2017   PTSD (post-traumatic stress disorder) 07/10/2017   Depression    Chest pain 03/29/2016   Syncope 03/29/2016   Tobacco use disorder 03/29/2016   Anxiety and depression 03/29/2016   Interscapular pain 03/29/2016    Patient's Medications  New Prescriptions   No medications on file  Previous Medications   BUPROPION (WELLBUTRIN XL) 150 MG 24 HR TABLET    Take 150 mg by mouth in the morning and at bedtime.   FLUOXETINE  (PROZAC ) 20 MG TABLET    Take 20 mg by mouth in the morning, at noon, and at bedtime.   FLUTICASONE-UMECLIDIN-VILANT (TRELEGY ELLIPTA ) 100-62.5-25 MCG/ACT AEPB    1 inhalation once daily   GLECAPREVIR-PIBRENTASVIR (MAVYRET ) 100-40 MG TABS    Take 3 tablets by mouth daily with breakfast.   METHOCARBAMOL  (ROBAXIN ) 500 MG TABLET    Take 1 tablet (500 mg total) by mouth every 6 (six) hours as needed for muscle spasms.  Modified Medications   No medications on file  Discontinued Medications   No medications on file    Labs: Hepatitis C Lab Results  Component  Value Date   HCVGENOTYPE 1b 01/16/2024   HCVRNAPCRQN <15 NOT DETECTED 04/23/2024   Hepatitis B Lab Results  Component Value Date   HEPBSAG Negative 12/24/2023   HEPBCAB Negative 12/24/2023   Hepatitis A Lab Results  Component Value Date   HAV Negative 12/24/2023   HIV Lab Results  Component Value Date   HIV Non Reactive 09/25/2023   Lab Results  Component Value Date   CREATININE 0.94 04/23/2024   CREATININE 0.82 10/25/2023   CREATININE 0.94 09/26/2023   CREATININE 0.81 09/25/2023   CREATININE 0.79 12/02/2021   Lab Results  Component Value Date   AST 22 04/23/2024   AST 66 (H) 10/25/2023   AST 42 (H) 09/25/2023   ALT 17 04/23/2024   ALT 55 (H) 10/25/2023   ALT 51 (H) 09/25/2023    Assessment: Zierra is here today to follow up for her Hep C infection. She was seen back in June where she was doing well 4 weeks into therapy. Her Hep C RNA was undetectable    Plan: ***  Fanny Agan L. Venancio Chenier, PharmD, BCIDP, AAHIVP, CPP Clinical Pharmacist Practitioner - Infectious Diseases Clinical Pharmacist Lead - Specialty Pharmacy [facility] for Infectious Disease

## 2024-05-26 ENCOUNTER — Ambulatory Visit: Payer: Self-pay | Admitting: Pharmacist

## 2024-06-02 NOTE — Progress Notes (Unsigned)
   HPI: Crystal Baker is a 58 y.o. female who presents to the [facility] pharmacy clinic for Hepatitis C follow-up.  Medication: Mavyret  x 8 weeks   Start Date: 03/25/24   Hepatitis C Genotype: 1b   Fibrosis Score: F3/4, Fib-4 2.92 (Age 79, AST 66, ALT 55, PLT 174). (Cutoff of >3.25 cirrhosis per AASLD). This indicates unlikely cirrhosis. PLT count also preserved (174).   Hepatitis C RNA: 2,090,000 on 12/24/23; <15 on 04/23/24  Patient Active Problem List   Diagnosis Date Noted   Chronic hepatitis C without hepatic coma (HCC) 01/15/2024   Hepatic steatosis 12/26/2023   Chronic neck pain 12/26/2023   Elevated liver enzymes 12/26/2023   Hyperglycemia 12/26/2023   Chronic obstructive pulmonary disease (HCC) 12/24/2023   Diverticulosis of colon 12/24/2023   Adenomatous polyp of colon 12/24/2023   Diverticulitis 09/26/2023   Diverticulitis large intestine 09/25/2023   Chronic bilateral low back pain 07/10/2017   PTSD (post-traumatic stress disorder) 07/10/2017   Depression    Chest pain 03/29/2016   Syncope 03/29/2016   Tobacco use disorder 03/29/2016   Anxiety and depression 03/29/2016   Interscapular pain 03/29/2016    Patient's Medications  New Prescriptions   No medications on file  Previous Medications   BUPROPION (WELLBUTRIN XL) 150 MG 24 HR TABLET    Take 150 mg by mouth in the morning and at bedtime.   FLUOXETINE  (PROZAC ) 20 MG TABLET    Take 20 mg by mouth in the morning, at noon, and at bedtime.   FLUTICASONE-UMECLIDIN-VILANT (TRELEGY ELLIPTA ) 100-62.5-25 MCG/ACT AEPB    1 inhalation once daily   GLECAPREVIR-PIBRENTASVIR (MAVYRET ) 100-40 MG TABS    Take 3 tablets by mouth daily with breakfast.   METHOCARBAMOL  (ROBAXIN ) 500 MG TABLET    Take 1 tablet (500 mg total) by mouth every 6 (six) hours as needed for muscle spasms.  Modified Medications   No medications on file  Discontinued Medications   No medications on file    Labs: Hepatitis C Lab Results  Component  Value Date   HCVGENOTYPE 1b 01/16/2024   HCVRNAPCRQN <15 NOT DETECTED 04/23/2024   Hepatitis B Lab Results  Component Value Date   HEPBSAG Negative 12/24/2023   HEPBCAB Negative 12/24/2023   Hepatitis A Lab Results  Component Value Date   HAV Negative 12/24/2023   HIV Lab Results  Component Value Date   HIV Non Reactive 09/25/2023   Lab Results  Component Value Date   CREATININE 0.94 04/23/2024   CREATININE 0.82 10/25/2023   CREATININE 0.94 09/26/2023   CREATININE 0.81 09/25/2023   CREATININE 0.79 12/02/2021   Lab Results  Component Value Date   AST 22 04/23/2024   AST 66 (H) 10/25/2023   AST 42 (H) 09/25/2023   ALT 17 04/23/2024   ALT 55 (H) 10/25/2023   ALT 51 (H) 09/25/2023    Assessment: Crystal Baker is here today to follow up for her Hep C infection. She was seen back in June where she was doing well 4 weeks into therapy. Her Hep C RNA was undetectable    Plan: - Completed Hep C treatment with Mavyret  - HCV RNA today - Follow up with Dr. Fleeta Rothman on ***  Crystal Baker, PharmD, BCIDP, AAHIVP, CPP Clinical Pharmacist Practitioner - Infectious Diseases Clinical Pharmacist Lead - Specialty Pharmacy [facility] for Infectious Disease

## 2024-06-03 ENCOUNTER — Encounter: Payer: Self-pay | Admitting: Internal Medicine

## 2024-06-03 ENCOUNTER — Other Ambulatory Visit: Payer: Self-pay

## 2024-06-03 ENCOUNTER — Ambulatory Visit: Payer: Self-pay | Admitting: Pharmacist

## 2024-06-03 DIAGNOSIS — B182 Chronic viral hepatitis C: Secondary | ICD-10-CM

## 2024-06-05 LAB — HEPATITIS C RNA QUANTITATIVE
HCV Quantitative Log: <1.18 {Log_IU}/mL
HCV RNA, PCR, QN: <15 [IU]/mL

## 2024-06-25 ENCOUNTER — Ambulatory Visit (INDEPENDENT_AMBULATORY_CARE_PROVIDER_SITE_OTHER): Payer: Self-pay | Admitting: Internal Medicine

## 2024-06-25 DIAGNOSIS — Z23 Encounter for immunization: Secondary | ICD-10-CM

## 2024-08-05 ENCOUNTER — Emergency Department (HOSPITAL_COMMUNITY): Payer: Self-pay

## 2024-08-05 ENCOUNTER — Encounter (HOSPITAL_COMMUNITY): Payer: Self-pay

## 2024-08-05 ENCOUNTER — Other Ambulatory Visit: Payer: Self-pay

## 2024-08-05 ENCOUNTER — Emergency Department (HOSPITAL_COMMUNITY)
Admission: EM | Admit: 2024-08-05 | Discharge: 2024-08-05 | Disposition: A | Discharge status: Home / Self Care | Payer: Self-pay | Attending: Emergency Medicine | Admitting: Emergency Medicine

## 2024-08-05 DIAGNOSIS — M79671 Pain in right foot: Secondary | ICD-10-CM | POA: Insufficient documentation

## 2024-08-05 MED ORDER — HYDROCODONE-ACETAMINOPHEN 5-325 MG PO TABS
2.0000 | ORAL_TABLET | Freq: Once | ORAL | Status: AC
  Administered 2024-08-05: 2 via ORAL
  Filled 2024-08-05: qty 2

## 2024-08-05 MED ORDER — HYDROCODONE-ACETAMINOPHEN 10-325 MG PO TABS: 1.0000 | ORAL_TABLET | Freq: Four times a day (QID) | ORAL | 0 refills | Status: AC | PRN

## 2024-08-05 MED ORDER — COLCHICINE 0.6 MG PO TABS: 0.6000 mg | ORAL_TABLET | Freq: Every day | ORAL | 0 refills | Status: DC

## 2024-08-05 NOTE — ED Provider Notes (Signed)
 [place] EMERGENCY DEPARTMENT AT [facility] Provider Note   CSN: [id number] Arrival date & time: 08/05/24  1017     Patient presents with: Foot Pain   Crystal Baker is a 58 y.o. female.   Patient complains of pain in her right foot.  Patient reports the top of her foot began swelling.  She has not had any injury.  Patient states that she had a history of the same about a month ago.  Patient complains of pain with walking.  Patient reports she is unable to put on her shoe.  Patient is not currently on any diuretics.   Foot Pain       Prior to Admission medications   Medication Sig Start Date End Date Taking? Authorizing Provider  colchicine 0.6 MG tablet Take 1 tablet (0.6 mg total) by mouth daily. 08/05/24 08/05/25 Yes Jahad Old K, PA-C  HYDROcodone -acetaminophen  (NORCO) 10-325 MG tablet Take 1 tablet by mouth every 6 (six) hours as needed for up to 5 days. 08/05/24 08/10/24 Yes Christopher Glasscock K, PA-C  buPROPion (WELLBUTRIN XL) 150 MG 24 hr tablet Take 150 mg by mouth in the morning and at bedtime.    [provider]  FLUoxetine  (PROZAC ) 20 MG tablet Take 20 mg by mouth in the morning, at noon, and at bedtime.    [provider]  Fluticasone-Umeclidin-Vilant (TRELEGY ELLIPTA ) 100-62.5-25 MCG/ACT AEPB 1 inhalation once daily Patient not taking: Reported on 01/16/2024 12/24/23   Adella Norris, MD  methocarbamol  (ROBAXIN ) 500 MG tablet Take 1 tablet (500 mg total) by mouth every 6 (six) hours as needed for muscle spasms. 05/09/24   Armenta Canning, MD    Allergies: Naproxen , Prednisone , and Tramadol     Review of Systems  All other systems reviewed and are negative.   Updated Vital Signs BP (!) 162/84 (BP Location: Right Arm)   Pulse 85   Temp 97.8 F (36.6 C) (Oral)   Resp 18   SpO2 99%   Physical Exam Vitals reviewed.  Constitutional:      Appearance: Normal appearance.  Musculoskeletal:        General: Swelling and  tenderness present.     Comments: Swollen tender area dorsal right foot pain with range of motion neurovascular neurosensory intact no erythema no sign of infection.  Skin:    General: Skin is warm.  Neurological:     General: No focal deficit present.     Mental Status: She is alert.     (all labs ordered are listed, but only abnormal results are displayed) Labs Reviewed - No data to display  EKG: None  Radiology: DG Foot Complete Right Result Date: 08/05/2024 CLINICAL DATA:  Acute right foot pain and swelling since yesterday without known injury. EXAM: RIGHT FOOT COMPLETE - 3+ VIEW COMPARISON:  None Available. FINDINGS: There is no evidence of fracture or dislocation. Mild hallux valgus deformity of first metatarsophalangeal joint is noted. Soft tissues are unremarkable. IMPRESSION: Mild hallux valgus deformity of first metatarsophalangeal joint. No acute abnormality seen. Electronically Signed   By: Lynwood Landy Raddle M.D.   On: 08/05/2024 12:24     Procedures   Medications Ordered in the ED  HYDROcodone -acetaminophen  (NORCO/VICODIN) 5-325 MG per tablet 2 tablet (2 tablets Oral Given 08/05/24 1117)                                    Medical Decision Making  Patient complains of swelling and pain on the top of her right foot  Amount and/or Complexity of Data Reviewed Radiology: ordered and independent interpretation performed. Decision-making details documented in ED Course.    Details: X-ray right foot shows no acute findings  Risk Prescription drug management. Risk Details: Patient has swelling on the top of her right foot, no known injury.  This would be an unusual area for gout but is the most likely explanation.  Patient is given a prescription for colchicine and hydrocodone .  Patient is given a note to be out of work for the next 3 days she is advised to follow-up with her primary care physician for recheck.        Final diagnoses:  Foot pain, right    ED  Discharge Orders          Ordered    HYDROcodone -acetaminophen  (NORCO) 10-325 MG tablet  Every 6 hours PRN        08/05/24 1249    colchicine 0.6 MG tablet  Daily        08/05/24 1249           An After Visit Summary was printed and given to the patient.     Flint Sonny POUR, [place] 08/05/24 1306    Dasie Faden, MD 08/06/24 [phone]

## 2024-08-05 NOTE — ED Triage Notes (Signed)
 Pt has swelling to the top of her right foot since yesterday. Pt denies injuries.

## 2024-08-05 NOTE — Discharge Instructions (Addendum)
 Return if any problems.  See your Physician for recheck in 3-4 days  ?
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
## 2024-09-03 ENCOUNTER — Ambulatory Visit (INDEPENDENT_AMBULATORY_CARE_PROVIDER_SITE_OTHER): Payer: Self-pay | Admitting: Infectious Disease

## 2024-09-03 ENCOUNTER — Other Ambulatory Visit: Payer: Self-pay

## 2024-09-03 ENCOUNTER — Encounter: Payer: Self-pay | Admitting: Infectious Disease

## 2024-09-03 VITALS — BP 127/77 | HR 79 | Temp 98.0°F | Ht 70.0 in | Wt 217.0 lb

## 2024-09-03 DIAGNOSIS — K760 Fatty (change of) liver, not elsewhere classified: Secondary | ICD-10-CM

## 2024-09-03 DIAGNOSIS — K7581 Nonalcoholic steatohepatitis (NASH): Secondary | ICD-10-CM

## 2024-09-03 DIAGNOSIS — B182 Chronic viral hepatitis C: Secondary | ICD-10-CM

## 2024-09-03 DIAGNOSIS — F4310 Post-traumatic stress disorder, unspecified: Secondary | ICD-10-CM

## 2024-09-05 LAB — HEPATITIS C RNA QUANTITATIVE
HCV Quantitative Log: <1.18 {Log_IU}/mL
HCV RNA, PCR, QN: <15 [IU]/mL

## 2024-09-08 ENCOUNTER — Ambulatory Visit: Payer: Self-pay | Admitting: Infectious Disease

## 2024-09-08 ENCOUNTER — Ambulatory Visit: Payer: Self-pay

## 2024-09-12 NOTE — Telephone Encounter (Signed)
 Patient advised her Hep C is cured per Dr. Fleeta Rothman

## 2024-12-25 ENCOUNTER — Encounter: Payer: Self-pay | Admitting: Internal Medicine
